# Patient Record
Sex: Female | Born: 1975 | Race: White | Hispanic: No | Smoking: Never smoker
Health system: Southern US, Community
[De-identification: ages and names within clinical notes are randomized; demographics above are authoritative.]

## PROBLEM LIST (undated history)

## (undated) ENCOUNTER — Ambulatory Visit: Admission: EM | Payer: No Typology Code available for payment source

## (undated) DIAGNOSIS — K449 Diaphragmatic hernia without obstruction or gangrene: Secondary | ICD-10-CM

## (undated) DIAGNOSIS — R112 Nausea with vomiting, unspecified: Secondary | ICD-10-CM

## (undated) DIAGNOSIS — IMO0001 Reserved for inherently not codable concepts without codable children: Secondary | ICD-10-CM

## (undated) DIAGNOSIS — N809 Endometriosis, unspecified: Secondary | ICD-10-CM

## (undated) DIAGNOSIS — Z9889 Other specified postprocedural states: Secondary | ICD-10-CM

## (undated) DIAGNOSIS — H9190 Unspecified hearing loss, unspecified ear: Secondary | ICD-10-CM

## (undated) HISTORY — PX: EAR MASTOIDECTOMY W/ COCHLEAR IMPLANT W/ LANDMARK: SHX1483

## (undated) HISTORY — DX: Endometriosis, unspecified: N80.9

## (undated) HISTORY — PX: DILATION AND CURETTAGE OF UTERUS: SHX78

---

## 2001-06-10 ENCOUNTER — Other Ambulatory Visit: Admission: RE | Admit: 2001-06-10 | Discharge: 2001-06-10 | Payer: Self-pay | Admitting: Family Medicine

## 2002-08-05 ENCOUNTER — Other Ambulatory Visit: Admission: RE | Admit: 2002-08-05 | Discharge: 2002-08-05 | Payer: Self-pay | Admitting: Family Medicine

## 2004-02-02 ENCOUNTER — Ambulatory Visit: Payer: Self-pay | Admitting: Internal Medicine

## 2004-04-25 ENCOUNTER — Ambulatory Visit: Payer: Self-pay | Admitting: Unknown Physician Specialty

## 2004-04-27 ENCOUNTER — Observation Stay: Payer: Self-pay | Admitting: Unknown Physician Specialty

## 2005-01-05 ENCOUNTER — Ambulatory Visit (HOSPITAL_COMMUNITY): Admission: RE | Admit: 2005-01-05 | Discharge: 2005-01-05 | Payer: Self-pay | Admitting: Obstetrics and Gynecology

## 2005-01-08 ENCOUNTER — Ambulatory Visit (HOSPITAL_COMMUNITY): Admission: RE | Admit: 2005-01-08 | Discharge: 2005-01-08 | Payer: Self-pay | Admitting: Obstetrics and Gynecology

## 2005-01-29 HISTORY — PX: DIAGNOSTIC LAPAROSCOPY: SUR761

## 2005-04-06 ENCOUNTER — Other Ambulatory Visit: Admission: RE | Admit: 2005-04-06 | Discharge: 2005-04-06 | Payer: Self-pay | Admitting: Obstetrics and Gynecology

## 2006-03-15 ENCOUNTER — Inpatient Hospital Stay (HOSPITAL_COMMUNITY): Admission: AD | Admit: 2006-03-15 | Discharge: 2006-03-18 | Payer: Self-pay | Admitting: Obstetrics and Gynecology

## 2006-11-08 IMAGING — US US OB TRANSVAGINAL
1 series · 14 of 24 positions shown · non-contrast
Comparison: none

CLINICAL DATA: No embryonic cardiac activity in the office.  
TRANSVAGINAL OBSTETRICAL US:
TECHNIQUE: Transvaginal ultrasound was performed for evaluation of the gestation as well as the maternal uterus and adnexal regions.

[Series 1: us ob transvaginal · 0.17mm/px · 14 of 24 slices shown]
[im 1/24]
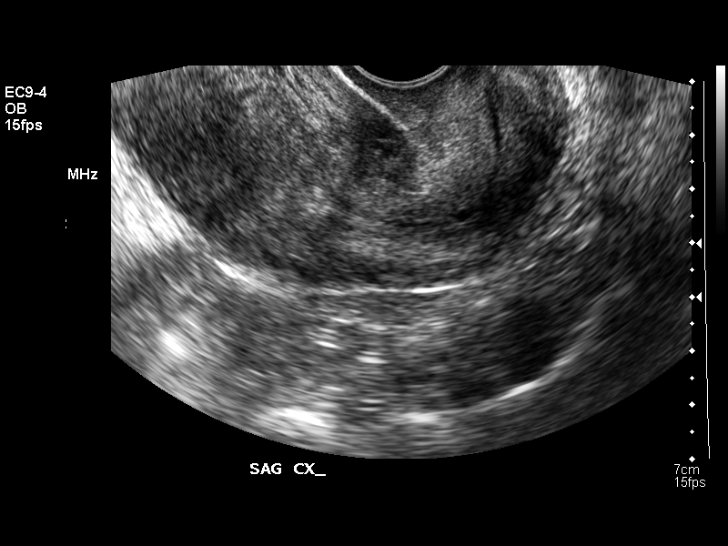
[im 3/24]
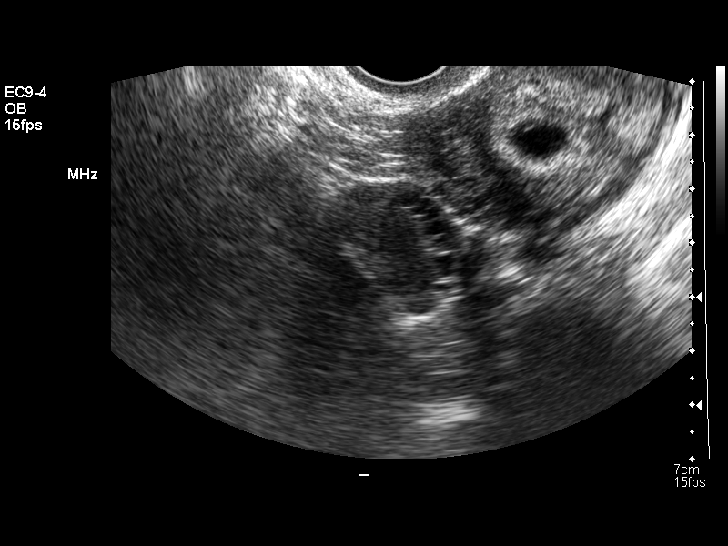
[im 5/24]
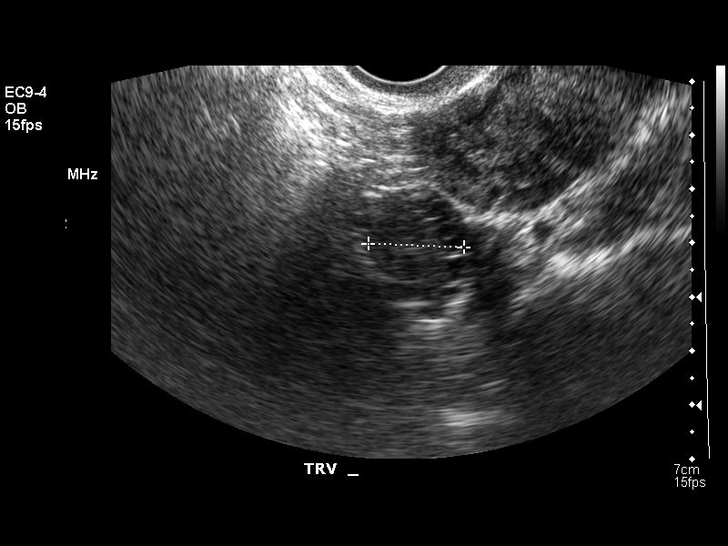
[im 7/24]
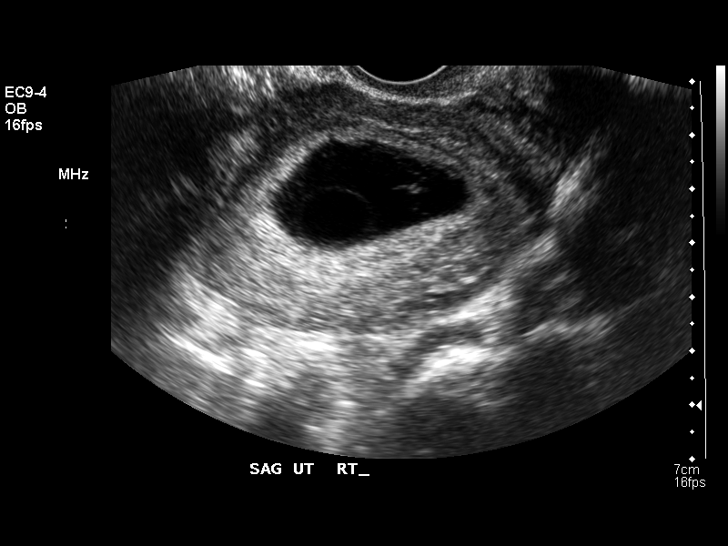
[im 8/24]
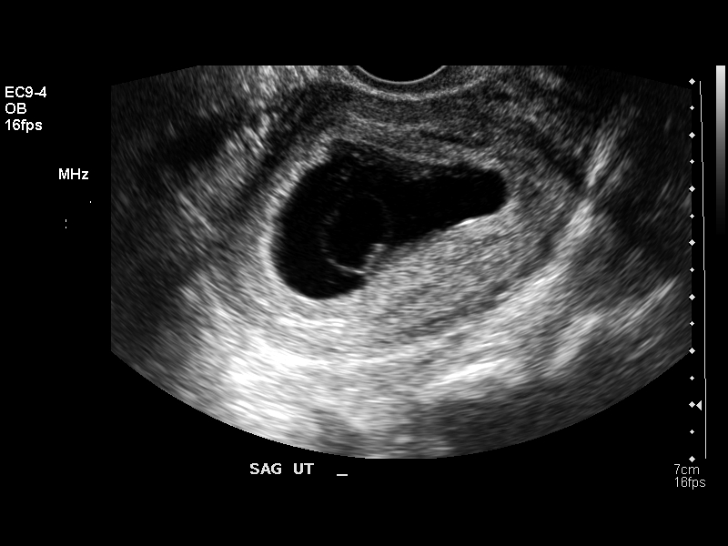
[im 10/24]
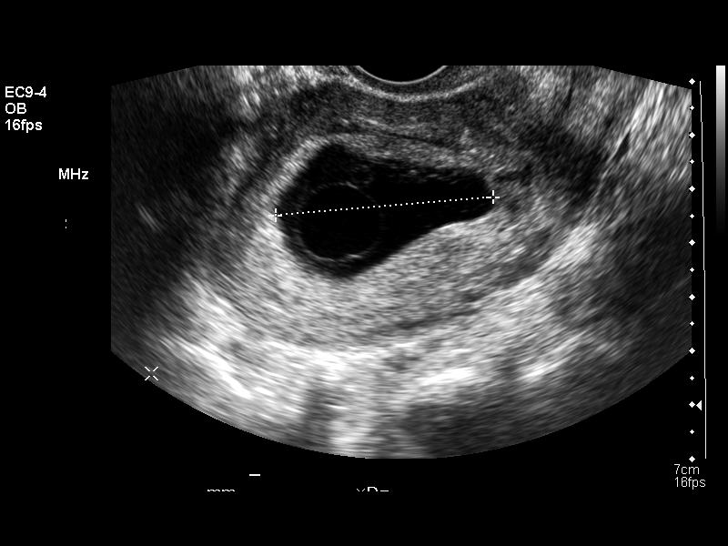
[im 12/24]
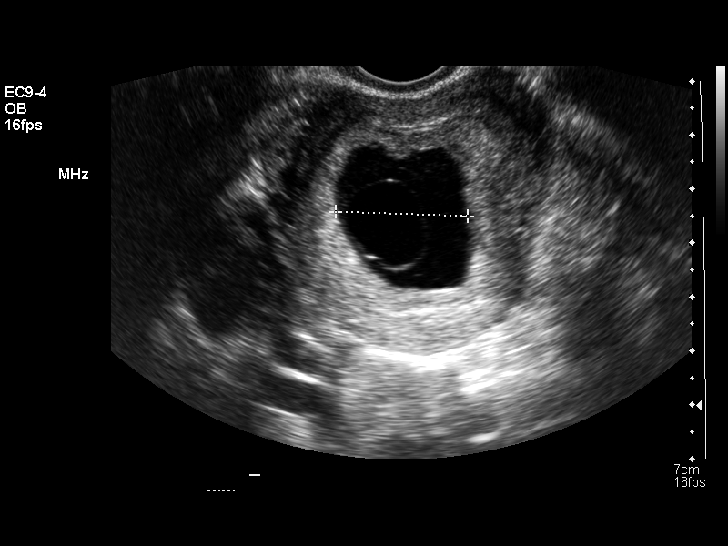
[im 13/24]
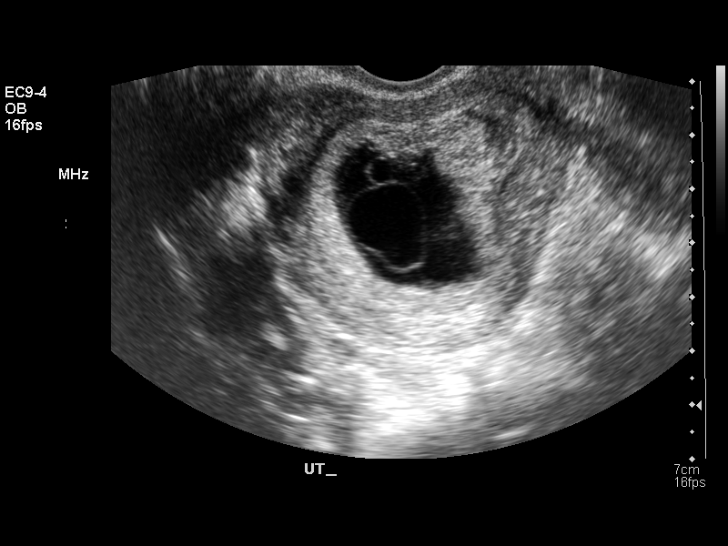
[im 15/24]
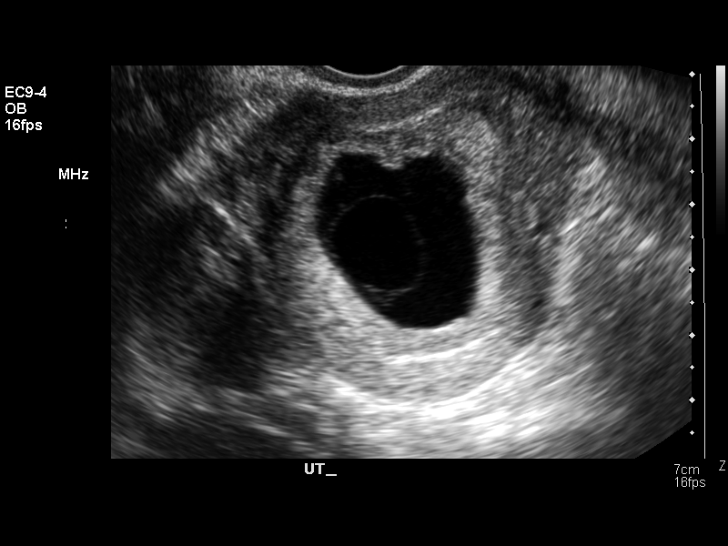
[im 17/24]
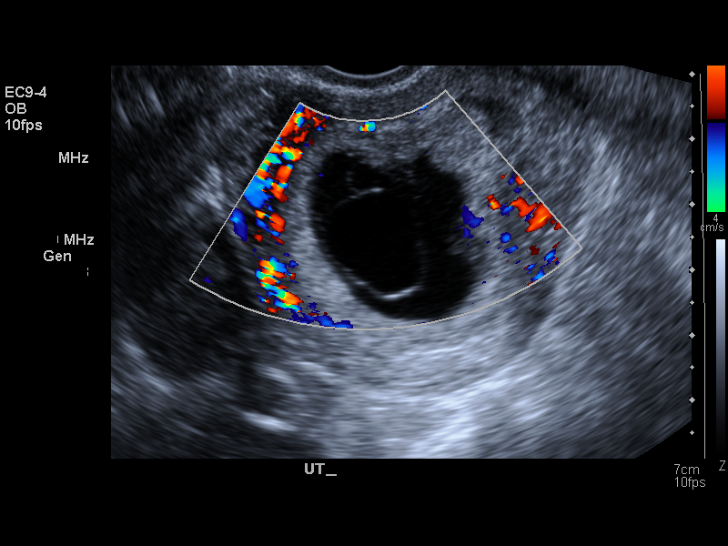
[im 19/24]
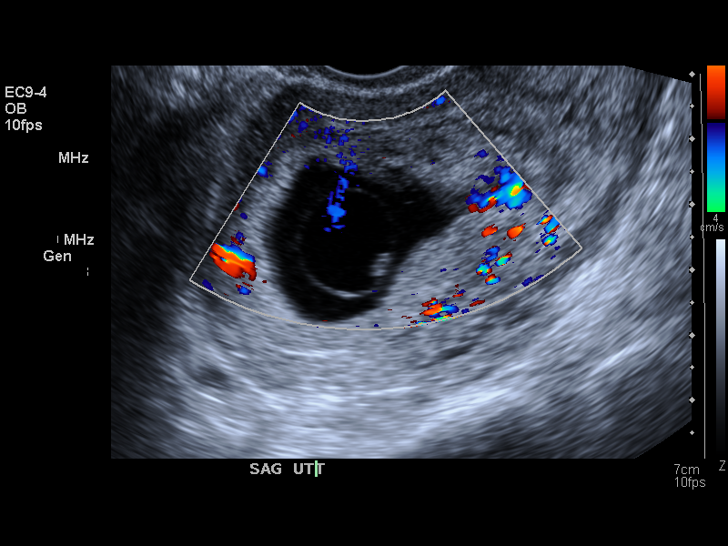
[im 20/24]
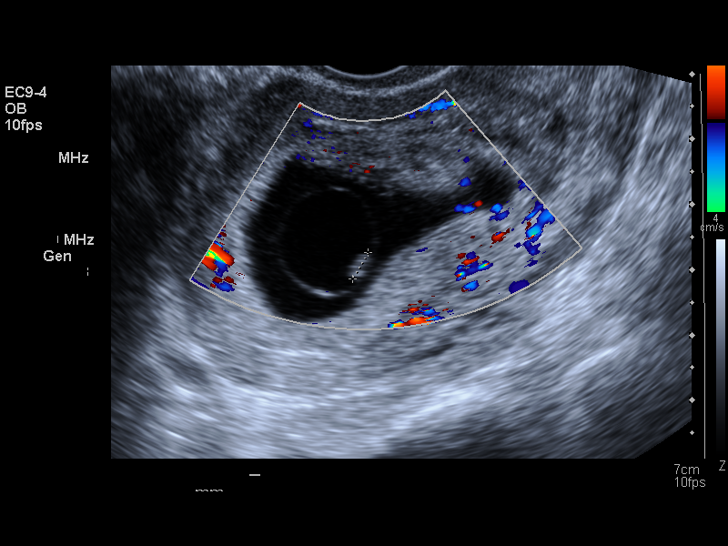
[im 22/24]
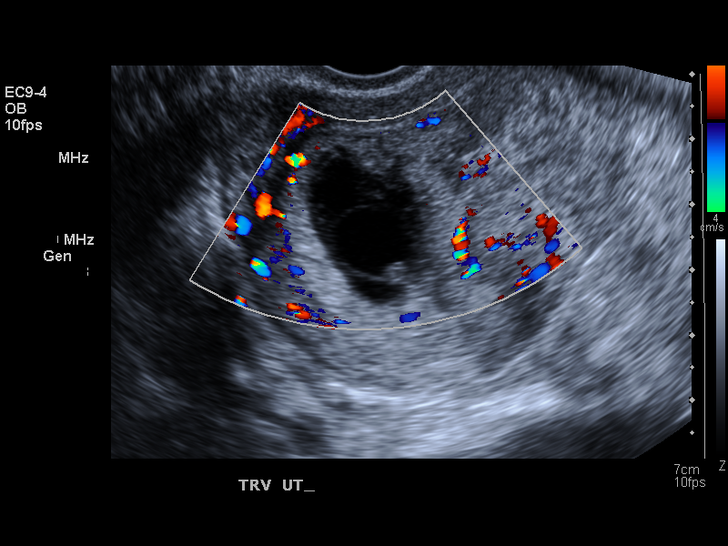
[im 24/24]
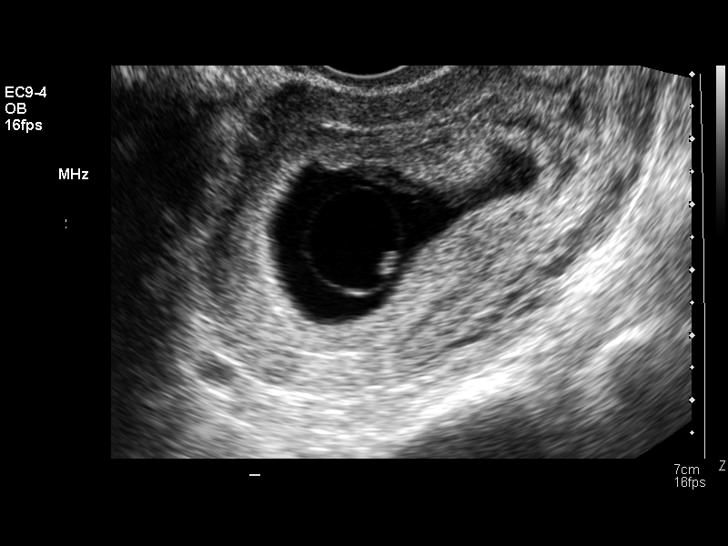

[14 of 24 positions shown; findings below may reference images not displayed]

FINDINGS: Endovaginal scanning demonstrates an intrauterine gestational sac.  The sac is irregular and has a mean sac diameter of approximately 3 cm which would suggest an 8 week 1 day gestation.  There is a very tiny embryonic pole identified within the amnion and crown rump length for this embryonic pole would be consistent with a 6 week 1 day gestation.  There is no evidence for embryonic cardiac motion on 2D Grayscale or Doppler imaging.
Right ovary is unremarkable.  The left ovary could not be identified.
IMPRESSION: Irregular gestational sac with a very tiny embryonic pole and an amnion which is enlarged out of proportion to the size of the embryo.  Correlation with the patient?s previous in office ultrasound is recommended as features are consistent with embryonic demise.

## 2010-05-04 ENCOUNTER — Other Ambulatory Visit: Payer: Self-pay | Admitting: Obstetrics and Gynecology

## 2010-05-10 ENCOUNTER — Other Ambulatory Visit: Payer: Self-pay

## 2010-05-11 ENCOUNTER — Ambulatory Visit
Admission: RE | Admit: 2010-05-11 | Discharge: 2010-05-11 | Disposition: A | Discharge status: Home / Self Care | Payer: PRIVATE HEALTH INSURANCE | Source: Ambulatory Visit | Attending: Obstetrics and Gynecology | Admitting: Obstetrics and Gynecology

## 2011-03-15 LAB — OB RESULTS CONSOLE GC/CHLAMYDIA: Gonorrhea: NEGATIVE

## 2011-03-15 LAB — OB RESULTS CONSOLE RPR: RPR: NONREACTIVE

## 2011-03-15 LAB — OB RESULTS CONSOLE ABO/RH: RH Type: POSITIVE

## 2011-08-21 ENCOUNTER — Other Ambulatory Visit: Payer: Self-pay | Admitting: Obstetrics and Gynecology

## 2011-08-27 ENCOUNTER — Encounter (HOSPITAL_COMMUNITY): Payer: Self-pay | Admitting: *Deleted

## 2011-08-27 ENCOUNTER — Inpatient Hospital Stay (HOSPITAL_COMMUNITY)
Admission: AD | Admit: 2011-08-27 | Discharge: 2011-08-31 | DRG: 765 | Disposition: A | Discharge status: Home / Self Care | Payer: PRIVATE HEALTH INSURANCE | Source: Ambulatory Visit | Attending: Obstetrics and Gynecology | Admitting: Obstetrics and Gynecology

## 2011-08-27 DIAGNOSIS — O479 False labor, unspecified: Secondary | ICD-10-CM

## 2011-08-27 DIAGNOSIS — O34219 Maternal care for unspecified type scar from previous cesarean delivery: Principal | ICD-10-CM | POA: Diagnosis present

## 2011-08-27 HISTORY — DX: Diaphragmatic hernia without obstruction or gangrene: K44.9

## 2011-08-27 HISTORY — DX: Other specified postprocedural states: R11.2

## 2011-08-27 HISTORY — DX: Unspecified hearing loss, unspecified ear: H91.90

## 2011-08-27 HISTORY — DX: Other specified postprocedural states: Z98.890

## 2011-08-27 HISTORY — DX: Reserved for inherently not codable concepts without codable children: IMO0001

## 2011-08-27 LAB — URINALYSIS, ROUTINE W REFLEX MICROSCOPIC
Hgb urine dipstick: NEGATIVE
Nitrite: NEGATIVE
Protein, ur: NEGATIVE mg/dL
Urobilinogen, UA: 0.2 mg/dL (ref 0.0–1.0)

## 2011-08-27 MED ORDER — LACTATED RINGERS IV SOLN
INTRAVENOUS | Status: DC
  Administered 2011-08-27 (×3): via INTRAVENOUS

## 2011-08-27 MED ORDER — MORPHINE SULFATE 4 MG/ML IJ SOLN
4.0000 mg | Freq: Once | INTRAMUSCULAR | Status: AC
  Administered 2011-08-27: 4 mg via INTRAVENOUS
  Filled 2011-08-27: qty 1

## 2011-08-27 MED ORDER — MORPHINE SULFATE 4 MG/ML IJ SOLN
1.0000 mg | Freq: Once | INTRAMUSCULAR | Status: AC
  Administered 2011-08-27: 1 mg via INTRAVENOUS

## 2011-08-27 MED ORDER — MORPHINE SULFATE 10 MG/ML IJ SOLN
5.0000 mg | Freq: Once | INTRAMUSCULAR | Status: AC
  Administered 2011-08-27: 5 mg via INTRAMUSCULAR
  Filled 2011-08-27: qty 1

## 2011-08-27 MED ORDER — PROMETHAZINE HCL 25 MG PO TABS
25.0000 mg | ORAL_TABLET | Freq: Once | ORAL | Status: AC
  Administered 2011-08-27: 25 mg via ORAL
  Filled 2011-08-27 (×2): qty 1

## 2011-08-27 MED ORDER — BUTORPHANOL TARTRATE 1 MG/ML IJ SOLN
2.0000 mg | Freq: Once | INTRAMUSCULAR | Status: AC
  Administered 2011-08-27: 2 mg via INTRAVENOUS
  Filled 2011-08-27: qty 2

## 2011-08-27 MED ORDER — HYDROMORPHONE HCL PF 1 MG/ML IJ SOLN
0.4000 mg | INTRAMUSCULAR | Status: DC | PRN
  Administered 2011-08-27: 0.4 mg via INTRAVENOUS
  Filled 2011-08-27: qty 1

## 2011-08-27 MED ORDER — NIFEDIPINE 10 MG PO CAPS
20.0000 mg | ORAL_CAPSULE | Freq: Three times a day (TID) | ORAL | Status: DC
  Administered 2011-08-27 – 2011-08-28 (×2): 20 mg via ORAL
  Filled 2011-08-27 (×3): qty 2

## 2011-08-27 MED ORDER — TERBUTALINE SULFATE 1 MG/ML IJ SOLN
0.2500 mg | Freq: Once | INTRAMUSCULAR | Status: AC
  Administered 2011-08-27: 0.25 mg via SUBCUTANEOUS
  Filled 2011-08-27: qty 1

## 2011-08-27 NOTE — MAU Note (Signed)
Scheduled for repeat c-section on 09/20/11;

## 2011-08-27 NOTE — MAU Provider Note (Signed)
History     CSN: [id number]  Arrival date and time: 08/27/11 1027   First Provider Initiated Contact with Patient 08/27/11 1220      Chief Complaint  Patient presents with  . Labor Eval   HPI Misty Lowe is a 36 y.o. female @ 102w4d gestation who presents to MAU for abdominal pain. The pain started approximately 05:30 am. She describes the pain sharp that comes and goes. She rates the pain as 9/10 at its worst and 3/10 currently. Associated symptoms include low back pain. She denies nausea or vomiting. First pregnancy preterm delivery @ [redacted] weeks gestation. C/S re: breech presentation. Scheduled for repeat C/S.  The history was provided by the patient.  OB History    Grav Para Term Preterm Abortions TAB SAB Ect Mult Living   2 1  1      1       Past Medical History  Diagnosis Date  . PONV (postoperative nausea and vomiting)   . Hearing impairment   . Hiatal hernia     Past Surgical History  Procedure Date  . Cesarean section   . Dilation and curettage of uterus   . 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 Other Neg Hx     History  Substance Use Topics  . Smoking status: Never Smoker   . Smokeless tobacco: Not on file  . Alcohol Use: No    Allergies:  Allergies  Allergen Reactions  . Amoxil (Amoxicillin) Anaphylaxis    Prescriptions prior to admission  Medication Sig Dispense Refill  . Prenatal Vit-Fe Fumarate-FA (PRENATAL MULTIVITAMIN) TABS Take 1 tablet by mouth at bedtime.      . progesterone (PROMETRIUM) 200 MG capsule Place 200 mg vaginally at bedtime.      . simethicone (GAS-X) 80 MG chewable tablet Chew 80 mg by mouth daily as needed. For heartburn        Review of Systems  Constitutional: Negative for fever, chills and weight loss.  HENT: Negative for ear pain, nosebleeds, congestion, sore throat and neck pain.   Eyes: Negative for blurred vision, double vision, photophobia and  pain.  Respiratory: Negative for cough, shortness of breath and wheezing.   Cardiovascular: Negative for chest pain, palpitations and leg swelling.  Gastrointestinal: Positive for abdominal pain. Negative for heartburn, nausea, vomiting, diarrhea and constipation.  Genitourinary: Positive for dysuria and frequency. Negative for urgency.  Musculoskeletal: Positive for back pain. Negative for myalgias.  Skin: Negative for itching and rash.  Neurological: Negative for dizziness, sensory change, speech change, seizures, weakness and headaches.  Endo/Heme/Allergies: Does not bruise/bleed easily.  Psychiatric/Behavioral: Negative for depression. The patient is not nervous/anxious.    Physical Exam   Blood pressure 111/76, pulse 71, temperature 97.6 F (36.4 C), temperature source Oral, resp. rate 18, height 5\' 1"  (1.549 m), weight 161 lb (73.029 kg).  Physical Exam  Nursing note and vitals reviewed. Constitutional: She is oriented to person, place, and time. She appears well-developed and well-nourished. No distress.  HENT:  Head: Normocephalic and atraumatic.  Eyes: EOM are normal.  Neck: Neck supple.  Cardiovascular: Normal rate.   Respiratory: Effort normal.  GI: Soft. There is no tenderness.  Genitourinary:       Cervix 2 cm, 90% -3.  Musculoskeletal: Normal range of motion.  Neurological: She is alert and oriented to person, place, and time.  Skin: Skin is warm and dry.  Psychiatric: She  has a normal mood and affect. Her behavior is normal. Judgment and thought content normal.   EFM: Baseline 130, reactive, contractions every 2 - 9 minutes.   Assessment: Preterm contractions  Plan:       Dr. Claiborne Billings notified and request IV fluids and recheck cervix in 1 to 2 hours.   Procedures  16:30 pm Dr. Claiborne Billings in to evaluate patient.  , 08/27/2011, 12:43 PM

## 2011-08-27 NOTE — MAU Note (Signed)
Hx of preterm labor and delivery at 34 weeks; c/o sharp pressure in lower abdomen since last night;

## 2011-08-27 NOTE — Progress Notes (Signed)
Pt still very uncomfortable with contractions, reports back pain and lower abd pain R>L. Today patient has received 5mg IM/5mg PO morphine with little relief.  She then received 0.4 IV dilaudid x 1 with some relief but requesting add'l dose after 1.5 hrs.  Pt received 20mg  po procardia, not due until approx 2am, also received one dose of terbutaline 0.25mg  iv with about 45 min of relief with ctx coming back mild at first, now 8/10.  On exam cervix is still a loose 2cm and 90 effaced, no change from 3 prior cervical checks throughout the day.  On palpation of lower abd, no tenderness but pt reports pain with ctx.  FHT have remained reactive and reassurring.  Ctx now 2-5 min.  Will try different pain regimen of 25po phenergan and 2mg  IV stadol.  Discussed with pt that since cervix is not changing c/s would not be indicated since she is later preterm.  Pt understands and will report worsening pain, bleeding, LOF for repeat cervical check.

## 2011-08-27 NOTE — MAU Note (Addendum)
Has been having small amt of thickish clear discharge past few days.  Having lower abd pain, since 0530, coming and going.   Delivered first child early

## 2011-08-27 NOTE — MAU Note (Signed)
On progesterone for PTL.  Scheduled for c/s on 08/22, primary was for breech.

## 2011-08-28 ENCOUNTER — Encounter (HOSPITAL_COMMUNITY): Payer: Self-pay | Admitting: *Deleted

## 2011-08-28 ENCOUNTER — Encounter (HOSPITAL_COMMUNITY): Payer: Self-pay | Admitting: Anesthesiology

## 2011-08-28 ENCOUNTER — Inpatient Hospital Stay (HOSPITAL_COMMUNITY): Payer: PRIVATE HEALTH INSURANCE | Admitting: Anesthesiology

## 2011-08-28 ENCOUNTER — Encounter (HOSPITAL_COMMUNITY)
Admission: AD | Disposition: A | Discharge status: Home / Self Care | Payer: Self-pay | Source: Ambulatory Visit | Attending: Obstetrics and Gynecology

## 2011-08-28 LAB — RPR: RPR Ser Ql: NONREACTIVE

## 2011-08-28 LAB — CBC
HCT: 33.9 % — ABNORMAL LOW (ref 36.0–46.0)
Hemoglobin: 11.6 g/dL — ABNORMAL LOW (ref 12.0–15.0)
MCH: 30.9 pg (ref 26.0–34.0)
RBC: 3.76 MIL/uL — ABNORMAL LOW (ref 3.87–5.11)

## 2011-08-28 LAB — TYPE AND SCREEN: ABO/RH(D): A POS

## 2011-08-28 SURGERY — Surgical Case
Anesthesia: Spinal | Site: Abdomen | Wound class: Clean Contaminated

## 2011-08-28 MED ORDER — TETANUS-DIPHTH-ACELL PERTUSSIS 5-2.5-18.5 LF-MCG/0.5 IM SUSP
0.5000 mL | Freq: Once | INTRAMUSCULAR | Status: AC
  Administered 2011-08-30: 0.5 mL via INTRAMUSCULAR
  Filled 2011-08-28: qty 0.5

## 2011-08-28 MED ORDER — PRENATAL MULTIVITAMIN CH
1.0000 | ORAL_TABLET | Freq: Every day | ORAL | Status: DC
  Administered 2011-08-28 – 2011-08-30 (×3): 1 via ORAL
  Filled 2011-08-28 (×4): qty 1

## 2011-08-28 MED ORDER — EPHEDRINE SULFATE 50 MG/ML IJ SOLN
INTRAMUSCULAR | Status: DC | PRN
  Administered 2011-08-28 (×2): 5 mg via INTRAVENOUS

## 2011-08-28 MED ORDER — METOCLOPRAMIDE HCL 5 MG/ML IJ SOLN: 10.0000 mg | Freq: Three times a day (TID) | INTRAMUSCULAR | Status: DC | PRN

## 2011-08-28 MED ORDER — GENTAMICIN SULFATE 40 MG/ML IJ SOLN: 5.0000 mg/kg | Freq: Once | INTRAVENOUS | Status: DC

## 2011-08-28 MED ORDER — LACTATED RINGERS IV SOLN
INTRAVENOUS | Status: DC
  Administered 2011-08-28: 14:00:00 via INTRAVENOUS

## 2011-08-28 MED ORDER — MEPERIDINE HCL 25 MG/ML IJ SOLN: 6.2500 mg | INTRAMUSCULAR | Status: DC | PRN

## 2011-08-28 MED ORDER — WITCH HAZEL-GLYCERIN EX PADS: 1.0000 "application " | MEDICATED_PAD | CUTANEOUS | Status: DC | PRN

## 2011-08-28 MED ORDER — ZOLPIDEM TARTRATE 5 MG PO TABS: 5.0000 mg | ORAL_TABLET | Freq: Every evening | ORAL | Status: DC | PRN

## 2011-08-28 MED ORDER — OXYTOCIN 40 UNITS IN LACTATED RINGERS INFUSION - SIMPLE MED: 62.5000 mL/h | INTRAVENOUS | Status: AC

## 2011-08-28 MED ORDER — MORPHINE SULFATE 0.5 MG/ML IJ SOLN
INTRAMUSCULAR | Status: AC
  Filled 2011-08-28: qty 10

## 2011-08-28 MED ORDER — OXYTOCIN 10 UNIT/ML IJ SOLN
INTRAMUSCULAR | Status: AC
  Filled 2011-08-28: qty 4

## 2011-08-28 MED ORDER — LACTATED RINGERS IV SOLN
INTRAVENOUS | Status: DC | PRN
  Administered 2011-08-28 (×2): via INTRAVENOUS

## 2011-08-28 MED ORDER — SODIUM CHLORIDE 0.9 % IV SOLN
1.0000 ug/kg/h | INTRAVENOUS | Status: DC | PRN
  Filled 2011-08-28: qty 2.5

## 2011-08-28 MED ORDER — PHENYLEPHRINE HCL 10 MG/ML IJ SOLN
INTRAMUSCULAR | Status: DC | PRN
  Administered 2011-08-28 (×3): 80 ug via INTRAVENOUS
  Administered 2011-08-28 (×2): 40 ug via INTRAVENOUS
  Administered 2011-08-28 (×5): 80 ug via INTRAVENOUS

## 2011-08-28 MED ORDER — HYDROMORPHONE HCL PF 1 MG/ML IJ SOLN: 0.2500 mg | INTRAMUSCULAR | Status: DC | PRN

## 2011-08-28 MED ORDER — ONDANSETRON HCL 4 MG/2ML IJ SOLN
INTRAMUSCULAR | Status: DC | PRN
  Administered 2011-08-28: 4 mg via INTRAVENOUS

## 2011-08-28 MED ORDER — NALOXONE HCL 0.4 MG/ML IJ SOLN: 0.4000 mg | INTRAMUSCULAR | Status: DC | PRN

## 2011-08-28 MED ORDER — KETOROLAC TROMETHAMINE 30 MG/ML IJ SOLN: 30.0000 mg | Freq: Four times a day (QID) | INTRAMUSCULAR | Status: AC | PRN

## 2011-08-28 MED ORDER — SIMETHICONE 80 MG PO CHEW
80.0000 mg | CHEWABLE_TABLET | Freq: Three times a day (TID) | ORAL | Status: DC
  Administered 2011-08-28 – 2011-08-31 (×9): 80 mg via ORAL

## 2011-08-28 MED ORDER — ONDANSETRON HCL 4 MG/2ML IJ SOLN: 4.0000 mg | Freq: Three times a day (TID) | INTRAMUSCULAR | Status: DC | PRN

## 2011-08-28 MED ORDER — PHENYLEPHRINE 40 MCG/ML (10ML) SYRINGE FOR IV PUSH (FOR BLOOD PRESSURE SUPPORT)
PREFILLED_SYRINGE | INTRAVENOUS | Status: AC
  Filled 2011-08-28: qty 20

## 2011-08-28 MED ORDER — CLINDAMYCIN PHOSPHATE 900 MG/50ML IV SOLN: 900.0000 mg | Freq: Once | INTRAVENOUS | Status: DC

## 2011-08-28 MED ORDER — KETOROLAC TROMETHAMINE 60 MG/2ML IM SOLN
60.0000 mg | Freq: Once | INTRAMUSCULAR | Status: AC | PRN
  Administered 2011-08-28: 60 mg via INTRAMUSCULAR

## 2011-08-28 MED ORDER — NALBUPHINE SYRINGE 5 MG/0.5 ML
INJECTION | INTRAMUSCULAR | Status: AC
  Administered 2011-08-28: 10 mg via SUBCUTANEOUS
  Filled 2011-08-28: qty 1

## 2011-08-28 MED ORDER — PRENATAL MULTIVITAMIN CH
1.0000 | ORAL_TABLET | Freq: Every day | ORAL | Status: DC
  Administered 2011-08-29 – 2011-08-30 (×2): 1 via ORAL
  Filled 2011-08-28: qty 1

## 2011-08-28 MED ORDER — SIMETHICONE 80 MG PO CHEW: 80.0000 mg | CHEWABLE_TABLET | ORAL | Status: DC | PRN

## 2011-08-28 MED ORDER — ONDANSETRON HCL 4 MG/2ML IJ SOLN: 4.0000 mg | INTRAMUSCULAR | Status: DC | PRN

## 2011-08-28 MED ORDER — DIPHENHYDRAMINE HCL 50 MG/ML IJ SOLN: 25.0000 mg | INTRAMUSCULAR | Status: DC | PRN

## 2011-08-28 MED ORDER — DIPHENHYDRAMINE HCL 25 MG PO CAPS: 25.0000 mg | ORAL_CAPSULE | Freq: Four times a day (QID) | ORAL | Status: DC | PRN

## 2011-08-28 MED ORDER — IBUPROFEN 600 MG PO TABS
600.0000 mg | ORAL_TABLET | Freq: Four times a day (QID) | ORAL | Status: DC | PRN
  Filled 2011-08-28 (×7): qty 1

## 2011-08-28 MED ORDER — SCOPOLAMINE 1 MG/3DAYS TD PT72
MEDICATED_PATCH | TRANSDERMAL | Status: AC
  Administered 2011-08-28: 1.5 mg via TRANSDERMAL
  Filled 2011-08-28: qty 1

## 2011-08-28 MED ORDER — IBUPROFEN 600 MG PO TABS
600.0000 mg | ORAL_TABLET | Freq: Four times a day (QID) | ORAL | Status: DC
  Administered 2011-08-28 – 2011-08-31 (×13): 600 mg via ORAL
  Filled 2011-08-28 (×6): qty 1

## 2011-08-28 MED ORDER — SCOPOLAMINE 1 MG/3DAYS TD PT72
1.0000 | MEDICATED_PATCH | Freq: Once | TRANSDERMAL | Status: DC
  Administered 2011-08-28: 1.5 mg via TRANSDERMAL

## 2011-08-28 MED ORDER — CITRIC ACID-SODIUM CITRATE 334-500 MG/5ML PO SOLN
ORAL | Status: AC
  Filled 2011-08-28: qty 15

## 2011-08-28 MED ORDER — SODIUM CHLORIDE 0.9 % IJ SOLN: 3.0000 mL | INTRAMUSCULAR | Status: DC | PRN

## 2011-08-28 MED ORDER — MENTHOL 3 MG MT LOZG: 1.0000 | LOZENGE | OROMUCOSAL | Status: DC | PRN

## 2011-08-28 MED ORDER — GENTAMICIN SULFATE 40 MG/ML IJ SOLN
Freq: Once | INTRAVENOUS | Status: AC
  Administered 2011-08-28: 100 mL via INTRAVENOUS
  Filled 2011-08-28: qty 7.24

## 2011-08-28 MED ORDER — FENTANYL CITRATE 0.05 MG/ML IJ SOLN
INTRAMUSCULAR | Status: AC
  Filled 2011-08-28: qty 2

## 2011-08-28 MED ORDER — OXYCODONE-ACETAMINOPHEN 5-325 MG PO TABS
1.0000 | ORAL_TABLET | ORAL | Status: DC | PRN
  Administered 2011-08-28: 1 via ORAL
  Administered 2011-08-29: 2 via ORAL
  Administered 2011-08-30 – 2011-08-31 (×4): 1 via ORAL
  Filled 2011-08-28 (×4): qty 1
  Filled 2011-08-28: qty 2
  Filled 2011-08-28: qty 1

## 2011-08-28 MED ORDER — ONDANSETRON HCL 4 MG PO TABS: 4.0000 mg | ORAL_TABLET | ORAL | Status: DC | PRN

## 2011-08-28 MED ORDER — NALBUPHINE HCL 10 MG/ML IJ SOLN
5.0000 mg | INTRAMUSCULAR | Status: DC | PRN
  Filled 2011-08-28: qty 1

## 2011-08-28 MED ORDER — EPHEDRINE 5 MG/ML INJ
INTRAVENOUS | Status: AC
  Filled 2011-08-28: qty 10

## 2011-08-28 MED ORDER — ONDANSETRON HCL 4 MG/2ML IJ SOLN
INTRAMUSCULAR | Status: AC
  Filled 2011-08-28: qty 2

## 2011-08-28 MED ORDER — NALBUPHINE HCL 10 MG/ML IJ SOLN
5.0000 mg | INTRAMUSCULAR | Status: DC | PRN
  Administered 2011-08-28: 10 mg via SUBCUTANEOUS
  Filled 2011-08-28: qty 1

## 2011-08-28 MED ORDER — DIPHENHYDRAMINE HCL 50 MG/ML IJ SOLN: 12.5000 mg | INTRAMUSCULAR | Status: DC | PRN

## 2011-08-28 MED ORDER — LANOLIN HYDROUS EX OINT: 1.0000 "application " | TOPICAL_OINTMENT | CUTANEOUS | Status: DC | PRN

## 2011-08-28 MED ORDER — SENNOSIDES-DOCUSATE SODIUM 8.6-50 MG PO TABS
2.0000 | ORAL_TABLET | Freq: Every day | ORAL | Status: DC
  Administered 2011-08-28 – 2011-08-30 (×3): 2 via ORAL

## 2011-08-28 MED ORDER — OXYTOCIN 10 UNIT/ML IJ SOLN
40.0000 [IU] | INTRAVENOUS | Status: DC | PRN
  Administered 2011-08-28: 40 [IU] via INTRAVENOUS

## 2011-08-28 MED ORDER — DIBUCAINE 1 % RE OINT: 1.0000 "application " | TOPICAL_OINTMENT | RECTAL | Status: DC | PRN

## 2011-08-28 MED ORDER — DIPHENHYDRAMINE HCL 25 MG PO CAPS: 25.0000 mg | ORAL_CAPSULE | ORAL | Status: DC | PRN

## 2011-08-28 MED ORDER — KETOROLAC TROMETHAMINE 60 MG/2ML IM SOLN
INTRAMUSCULAR | Status: AC
  Administered 2011-08-28: 60 mg via INTRAMUSCULAR
  Filled 2011-08-28: qty 2

## 2011-08-28 SURGICAL SUPPLY — 30 items
CHLORAPREP W/TINT 26ML (MISCELLANEOUS) ×2 IMPLANT
CLOTH BEACON ORANGE TIMEOUT ST (SAFETY) ×2 IMPLANT
DRESSING TELFA 8X3 (GAUZE/BANDAGES/DRESSINGS) ×2 IMPLANT
DRSG COVADERM 4X10 (GAUZE/BANDAGES/DRESSINGS) ×2 IMPLANT
ELECT REM PT RETURN 9FT ADLT (ELECTROSURGICAL) ×2
ELECTRODE REM PT RTRN 9FT ADLT (ELECTROSURGICAL) ×1 IMPLANT
EXTRACTOR VACUUM M CUP 4 TUBE (SUCTIONS) IMPLANT
GAUZE SPONGE 4X4 12PLY STRL LF (GAUZE/BANDAGES/DRESSINGS) ×4 IMPLANT
GLOVE BIO SURGEON STRL SZ 6.5 (GLOVE) ×2 IMPLANT
GLOVE BIOGEL PI IND STRL 6.5 (GLOVE) ×2 IMPLANT
GLOVE BIOGEL PI INDICATOR 6.5 (GLOVE) ×2
GOWN PREVENTION PLUS LG XLONG (DISPOSABLE) ×4 IMPLANT
KIT ABG SYR 3ML LUER SLIP (SYRINGE) IMPLANT
NEEDLE HYPO 25X5/8 SAFETYGLIDE (NEEDLE) IMPLANT
NS IRRIG 1000ML POUR BTL (IV SOLUTION) ×2 IMPLANT
PACK C SECTION WH (CUSTOM PROCEDURE TRAY) ×2 IMPLANT
PAD ABD 7.5X8 STRL (GAUZE/BANDAGES/DRESSINGS) ×2 IMPLANT
RTRCTR C-SECT PINK 25CM LRG (MISCELLANEOUS) ×2 IMPLANT
SLEEVE SCD COMPRESS KNEE MED (MISCELLANEOUS) ×2 IMPLANT
STAPLER VISISTAT 35W (STAPLE) ×2 IMPLANT
SUT MON AB 2-0 CT1 27 (SUTURE) ×2 IMPLANT
SUT MON AB 4-0 PS1 27 (SUTURE) IMPLANT
SUT PLAIN 2 0 XLH (SUTURE) IMPLANT
SUT VIC AB 0 CTX 36 (SUTURE) ×8
SUT VIC AB 0 CTX36XBRD ANBCTRL (SUTURE) ×4 IMPLANT
SUT VIC AB 4-0 KS 27 (SUTURE) IMPLANT
TAPE CLOTH SURG 4X10 WHT LF (GAUZE/BANDAGES/DRESSINGS) ×2 IMPLANT
TOWEL OR 17X24 6PK STRL BLUE (TOWEL DISPOSABLE) ×4 IMPLANT
TRAY FOLEY CATH 14FR (SET/KITS/TRAYS/PACK) ×2 IMPLANT
WATER STERILE IRR 1000ML POUR (IV SOLUTION) IMPLANT

## 2011-08-28 NOTE — Anesthesia Postprocedure Evaluation (Signed)
  Anesthesia Post Note  Patient: Misty Lowe  Procedure(s) Performed: Procedure(s) (LRB): CESAREAN SECTION (N/A)  Anesthesia type: Spinal  Patient location: Mother/Baby  Post pain: Pain level controlled  Post assessment: Post-op Vital signs reviewed  Last Vitals:  Filed Vitals:   08/28/11 1400  BP:   Pulse:   Temp:   Resp: 18    Post vital signs: Reviewed  Level of consciousness: awake  Complications: No apparent anesthesia complications

## 2011-08-28 NOTE — Anesthesia Preprocedure Evaluation (Addendum)
Anesthesia Evaluation  Patient identified by MRN, date of birth, ID band Patient awake    Reviewed: Allergy & Precautions, H&P , Patient's Chart, lab work & pertinent test results  History of Anesthesia Complications (+) PONV  Airway Mallampati: II TM Distance: >3 FB Neck ROM: full    Dental No notable dental hx.    Pulmonary  breath sounds clear to auscultation  Pulmonary exam normal       Cardiovascular Exercise Tolerance: Good Rhythm:regular Rate:Normal     Neuro/Psych    GI/Hepatic   Endo/Other    Renal/GU      Musculoskeletal   Abdominal   Peds  Hematology   Anesthesia Other Findings   Reproductive/Obstetrics                           Anesthesia Physical Anesthesia Plan  ASA: II and Emergent  Anesthesia Plan: Spinal   Post-op Pain Management:    Induction:   Airway Management Planned:   Additional Equipment:   Intra-op Plan:   Post-operative Plan:   Informed Consent: I have reviewed the patients History and Physical, chart, labs and discussed the procedure including the risks, benefits and alternatives for the proposed anesthesia with the patient or authorized representative who has indicated his/her understanding and acceptance.   Dental Advisory Given  Plan Discussed with: CRNA  Anesthesia Plan Comments: (Lab work confirmed with CRNA in room. Platelets okay. Discussed spinal anesthetic, and patient consents to the procedure:  included risk of possible headache,backache, failed block, allergic reaction, and nerve injury. This patient was asked if she had any questions or concerns before the procedure started. )        Anesthesia Quick Evaluation

## 2011-08-28 NOTE — H&P (Signed)
37 y.o. [redacted]w[redacted]d  G2P0101 comes in c/o contractions beginning about 5am.  Otherwise has good fetal movement and no bleeding.  Past Medical History  Diagnosis Date  . PONV (postoperative nausea and vomiting)   . Hearing impairment   . Hiatal hernia     Past Surgical History  Procedure Date  . Cesarean section   . Dilation and curettage of uterus   . 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 Marital Status: Married    Spouse Name: N/A    Number of Children: N/A  . Years of Education: N/A   Occupational History  . Not on file.   Social History Main Topics  . Smoking status: Never Smoker   . Smokeless tobacco: Not on file  . Alcohol Use: No  . Drug Use: No  . Sexually Active: Yes   Other Topics Concern  . Not on file   Social History Narrative  . No narrative on file   Amoxil   Prenatal Course:  Uncomplicated.  Prior c/s for PPROM/breech at ~34wks.  Filed Vitals:   08/27/11 2200  BP:   Pulse: 96  Temp:   Resp:      Lungs/Cor:  NAD Abdomen:  soft, gravid Ex:  no cords, erythema SVE:  2/90/-2, rechecked 3 times over almost 8 hrs FHTs:  120, good STV, NST R Toco:  q 5   A/P   Admit for observation and pain control, unchanged cervix  Declines VBAC  GBS unknown  See progress note for pain control regimens.  Philip Aspen

## 2011-08-28 NOTE — Anesthesia Postprocedure Evaluation (Signed)
Anesthesia Post Note  Patient: Misty Lowe  Procedure(s) Performed: Procedure(s) (LRB): CESAREAN SECTION (N/A)  Anesthesia type: Spinal  Patient location: PACU  Post pain: Pain level controlled  Post assessment: Post-op Vital signs reviewed  Last Vitals:  Filed Vitals:   08/28/11 0815  BP: 108/86  Pulse: 80  Temp:   Resp: 21    Post vital signs: Reviewed  Level of consciousness: awake  Complications: No apparent anesthesia complications

## 2011-08-28 NOTE — Addendum Note (Signed)
Addendum  created 08/28/11 1411 by Algis Greenhouse, CRNA   Modules edited:Notes Section

## 2011-08-28 NOTE — Anesthesia Procedure Notes (Signed)

## 2011-08-28 NOTE — Op Note (Addendum)
Preop dx: 35.5 preterm labor, (cervical change 2 to 4-5cm) desired repeat c/s Postop dx: same Procedure: repeat LTCS Surgeon: Delray Alt Anesthesia: spinal EBL: 600 Fluids: 2300 UOP: 350 clear at end of case Specimen: cord blood Findings: abundant scar tissue involving the bladder, vesicouterine peritoneum and anterior abd wall, as well as R adnexa - ovary not visualized.  Normal ovary and tube on L side.  Female infant in cephalic presentation, vigorous with APGARS 9/9, weight pending Complications: none Condition: stable to pacu

## 2011-08-28 NOTE — Transfer of Care (Signed)
Immediate Anesthesia Transfer of Care Note  Patient:  Misty Lowe  Procedure(s) Performed: Procedure(s) (LRB): CESAREAN SECTION (N/A)  Patient Location: PACU  Anesthesia Type: Spinal  Level of Consciousness: awake, alert  and oriented  Airway & Oxygen Therapy: Patient Spontanous Breathing  Post-op Assessment: Report given to PACU RN  Post vital signs: Reviewed and stable  Complications: No apparent anesthesia complications

## 2011-08-29 ENCOUNTER — Encounter (HOSPITAL_COMMUNITY): Payer: Self-pay | Admitting: Obstetrics and Gynecology

## 2011-08-29 LAB — CBC
Hemoglobin: 10.6 g/dL — ABNORMAL LOW (ref 12.0–15.0)
MCHC: 33.2 g/dL (ref 30.0–36.0)
RBC: 3.41 MIL/uL — ABNORMAL LOW (ref 3.87–5.11)
WBC: 9.7 10*3/uL (ref 4.0–10.5)

## 2011-08-29 NOTE — Progress Notes (Signed)
  Patient is eating, ambulating, voiding.  Pain control is good.  Filed Vitals:   08/28/11 1852 08/28/11 2100 08/29/11 0100 08/29/11 0500  BP:  93/53 91/52 87/52   Pulse:  93 96 74  Temp:  99.1 F (37.3 C) 98.5 F (36.9 C) 98.5 F (36.9 C)  TempSrc:  Oral Oral Oral  Resp: 20 18 20 18   Height:      Weight:      SpO2: 96% 96% 96% 97%    lungs:   clear to auscultation cor:    RRR Abdomen:  soft, appropriate tenderness, incisions intact and without erythema or exudate ex:    no cords   Lab Results  Component Value Date   WBC 9.7 08/29/2011   HGB 10.6* 08/29/2011   HCT 31.9* 08/29/2011   MCV 93.5 08/29/2011   PLT 149* 08/29/2011    --/--/A POS (07/30 0450)/RI  A/P    Post operative day 1.  Routine post op and postpartum care.  Expect d/c tomorrow.  Percocet for pain control.  Baby too small for circ currently per nursery.

## 2011-08-30 NOTE — Progress Notes (Signed)
POD#2 Pt without c/o. Adequate pain control. Tolerating diet. VSSAF IMP/ Stable Plan/ Probable discharge in am.                                   vssaf

## 2011-08-31 MED ORDER — OXYCODONE-ACETAMINOPHEN 5-325 MG PO TABS: 1.0000 | ORAL_TABLET | ORAL | Status: AC | PRN

## 2011-08-31 NOTE — Discharge Summary (Signed)
Obstetric Discharge Summary Reason for Admission: onset of labor Prenatal Procedures: ultrasound, progesterone therapy for h/o previous preterm delivery. Intrapartum Procedures: cesarean: low cervical, transverse Postpartum Procedures: none Complications-Operative and Postpartum: none Hemoglobin  Date Value Range Status  08/29/2011 10.6* 12.0 - 15.0 g/dL Final     HCT  Date Value Range Status  08/29/2011 31.9* 36.0 - 46.0 % Final    Physical Exam:  General: alert Lochia: appropriate Uterine Fundus: firm Incision: healing well DVT Evaluation: No evidence of DVT seen on physical exam.  Discharge Diagnoses: Term Pregnancy-delivered, Repeat cesarean section  Discharge Information: Date: 08/31/2011 Activity: Limited Diet: routine Medications: PNV, Ibuprofen and Percocet Condition: stable Instructions: refer to practice specific booklet Discharge to: home Follow-up Information    Follow up with CALLAHAN, SIDNEY, DO.   Contact information:   [address] [facility] [place] [address] [phone]          Newborn Data: Live born female  Birth Weight: 4 lb 11.7 oz (2145 g) APGAR: 9, 9  Home with mother.  , D 08/31/2011, 10:39 AM

## 2011-09-04 ENCOUNTER — Ambulatory Visit (HOSPITAL_COMMUNITY)
Admission: RE | Admit: 2011-09-04 | Discharge: 2011-09-04 | Disposition: A | Discharge status: Home / Self Care | Payer: PRIVATE HEALTH INSURANCE | Source: Ambulatory Visit | Attending: Obstetrics and Gynecology | Admitting: Obstetrics and Gynecology

## 2011-09-13 ENCOUNTER — Other Ambulatory Visit (HOSPITAL_COMMUNITY): Payer: PRIVATE HEALTH INSURANCE

## 2011-09-20 ENCOUNTER — Inpatient Hospital Stay (HOSPITAL_COMMUNITY): Admission: AD | Admit: 2011-09-20 | Payer: Self-pay | Source: Ambulatory Visit | Admitting: Obstetrics and Gynecology

## 2011-09-20 SURGERY — Surgical Case
Anesthesia: Regional

## 2012-03-13 IMAGING — US US RENAL
1 series · 14 of 25 positions shown · non-contrast
Comparison: None.

CLINICAL DATA: Hematuria.  Recent urinary tract infection.

RENAL/URINARY TRACT ULTRASOUND COMPLETE 05/11/2010:

[Series 1: us renal · 0.27mm/px · 14 of 30 slices shown]
[im 1/30]
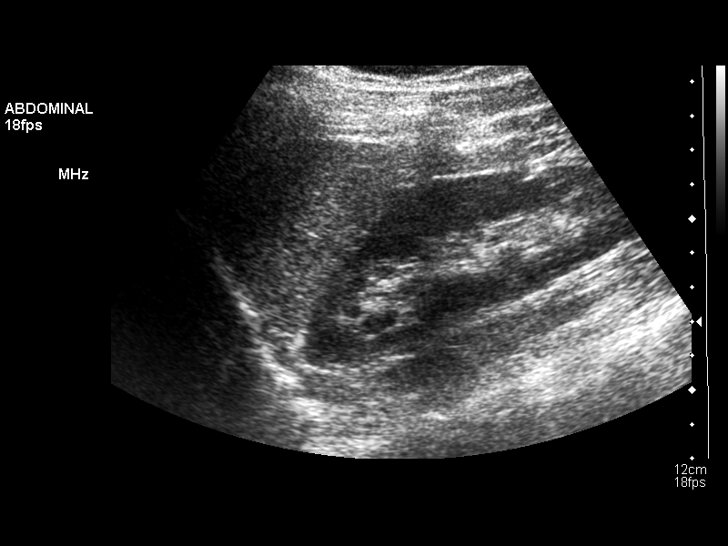
[im 3/30]
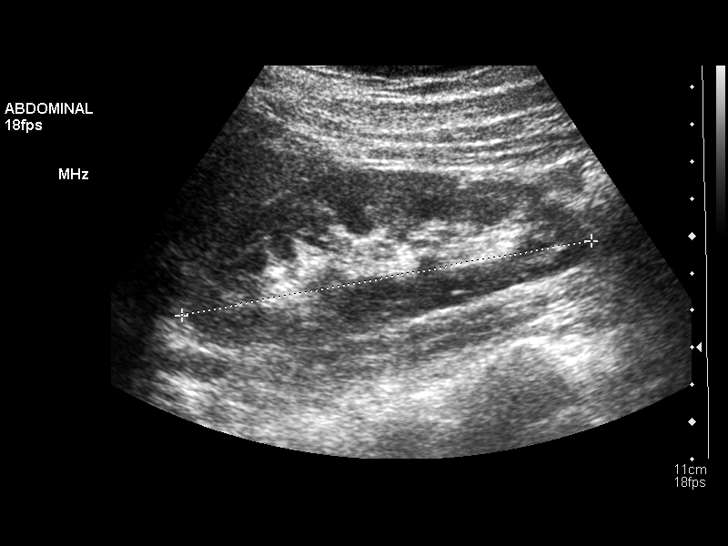
[im 5/30]
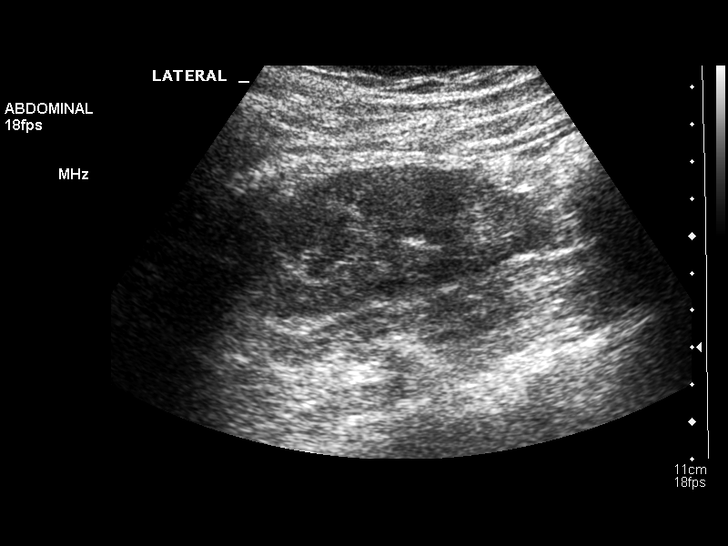
[im 8/30]
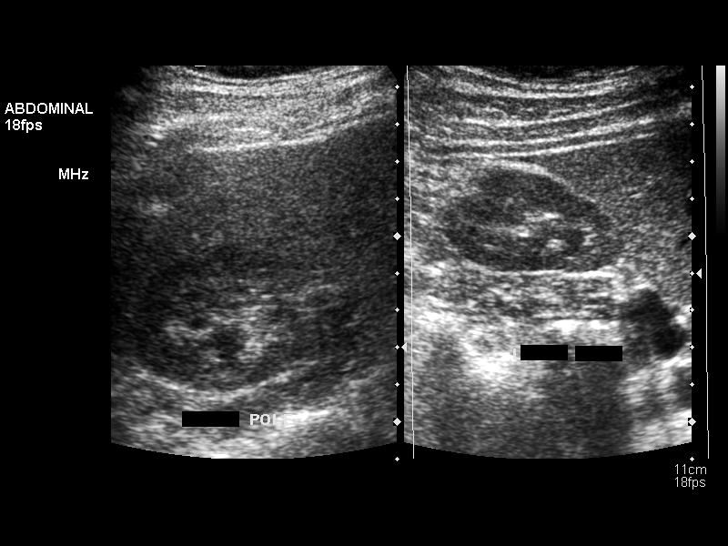
[im 10/30]
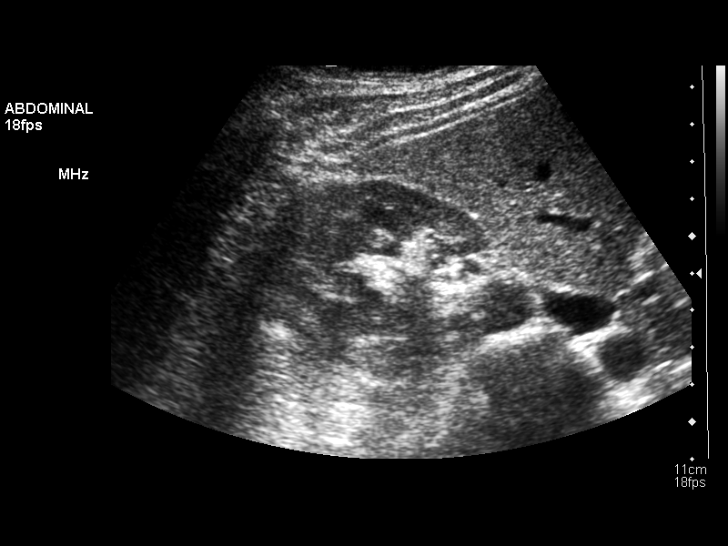
[im 11/30]
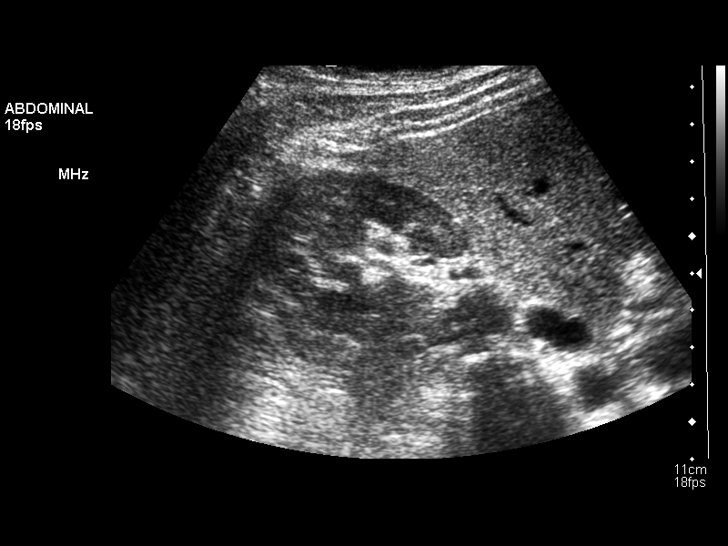
[im 14/30]
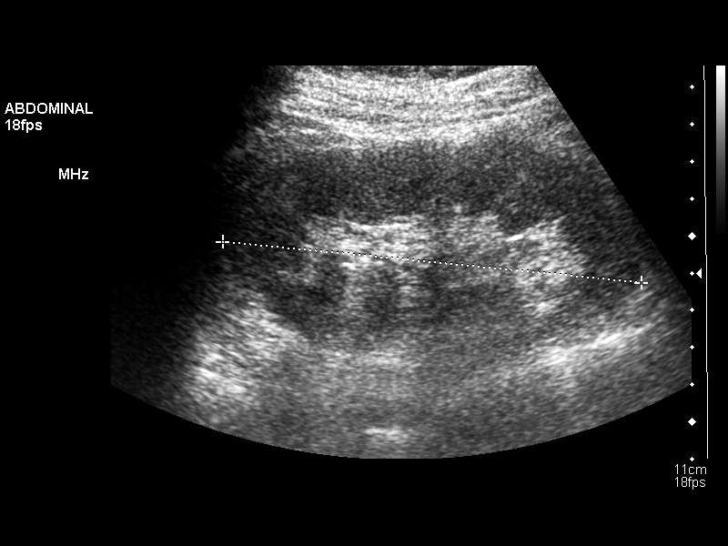
[im 16/30]
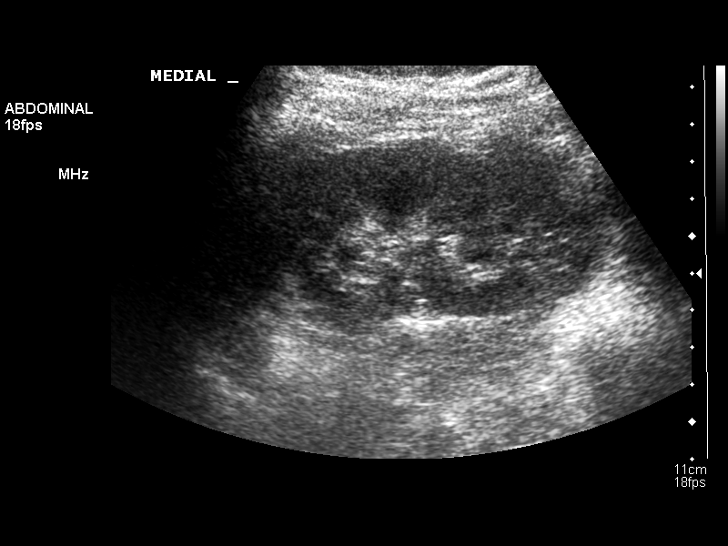
[im 19/30]
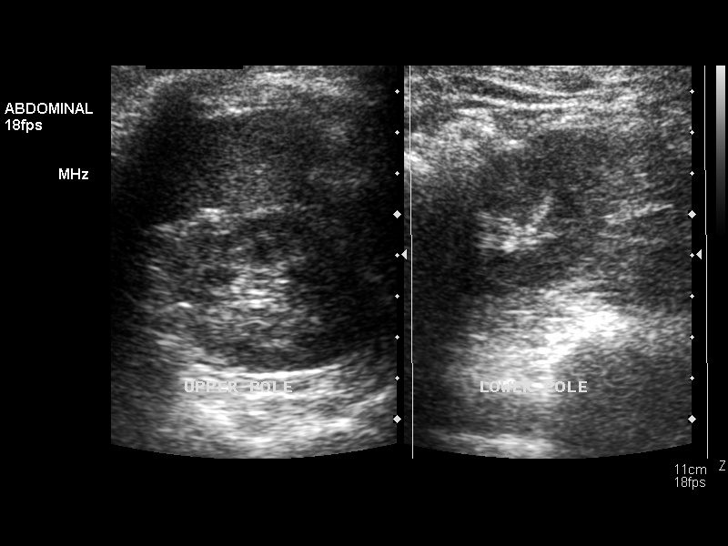
[im 20/30]
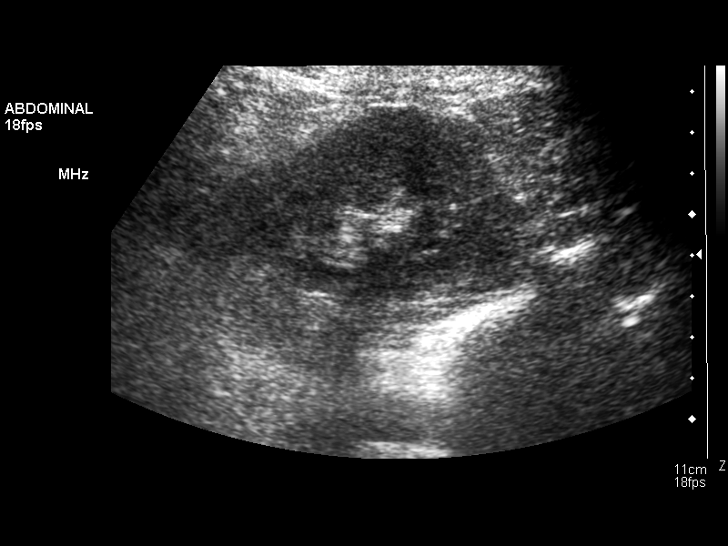
[im 22/30]
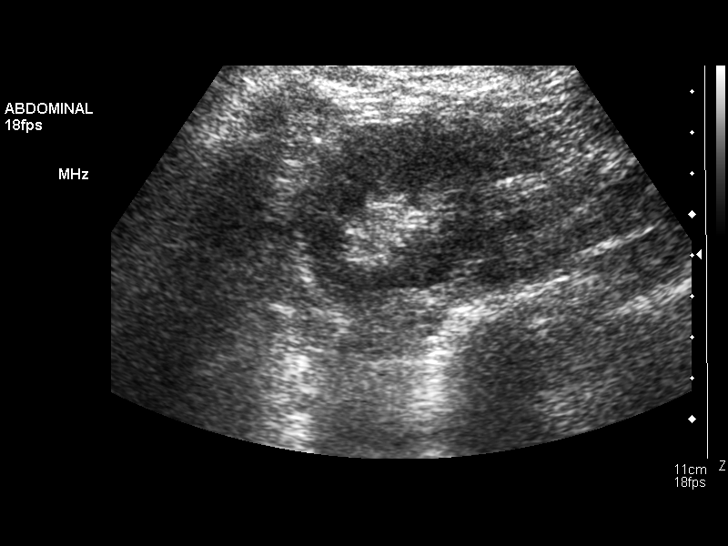
[im 25/30]
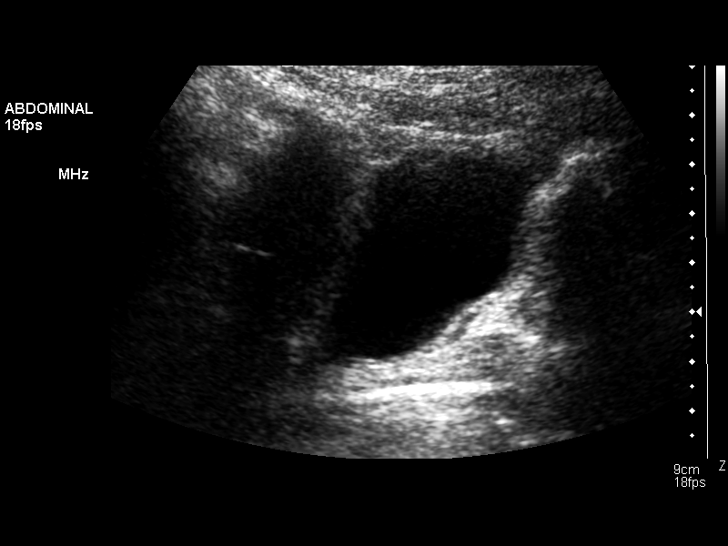
[im 27/30]
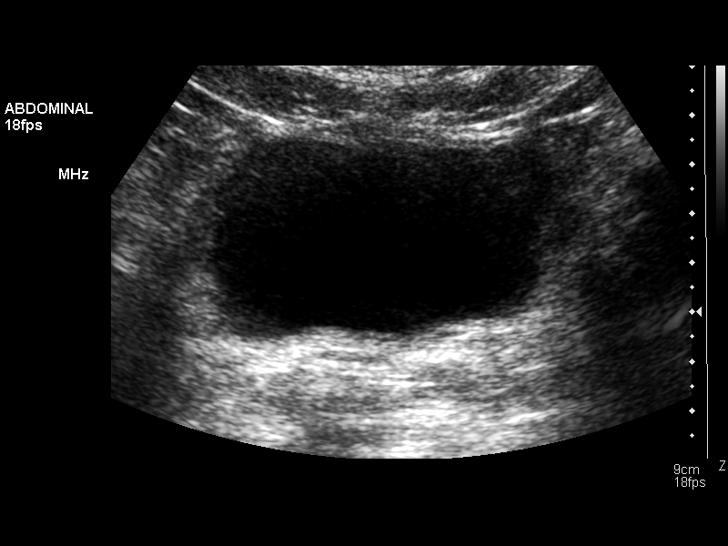
[im 30/30]
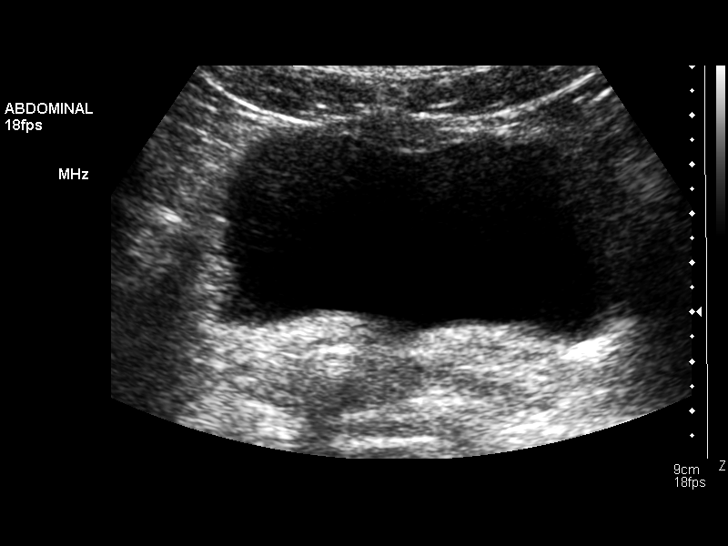

[14 of 25 positions shown; findings below may reference images not displayed]

FINDINGS: Right Kidney:  No hydronephrosis.  Well-preserved cortex.  No
shadowing calculi.  Normal size and parenchymal echotexture without
focal abnormalities.  Approximately 11.2 cm length.

Left Kidney:  No hydronephrosis.  Well-preserved cortex.  No
shadowing calculi.  Normal size and parenchymal echotexture without
focal abnormalities.  Approximately 11.3 cm length.

Bladder:  Relatively decompressed and normal in appearance.
IMPRESSION: Normal urinary tract ultrasound.

## 2012-06-24 ENCOUNTER — Ambulatory Visit: Payer: Self-pay | Admitting: General Practice

## 2013-01-26 ENCOUNTER — Emergency Department: Payer: Self-pay | Admitting: Emergency Medicine

## 2013-02-10 ENCOUNTER — Ambulatory Visit: Payer: Self-pay | Admitting: General Practice

## 2013-02-10 IMAGING — US US RENAL KIDNEY
1 series · 14 of 25 positions shown · non-contrast
Comparison: None.

CLINICAL DATA: Left lower quadrant pain.  Hematuria.

EXAM:
RENAL/URINARY TRACT ULTRASOUND COMPLETE

[Series 1: us renal kidney · 0.21mm/px · 14 of 37 slices shown]
[im 1/37]
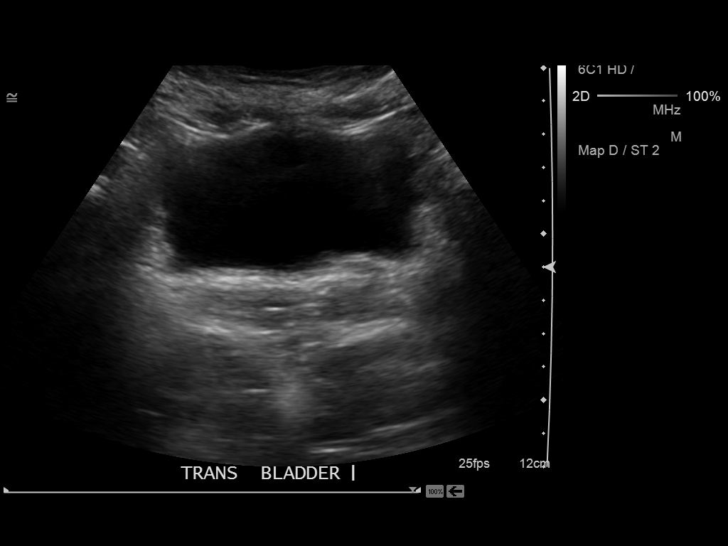
[im 4/37]
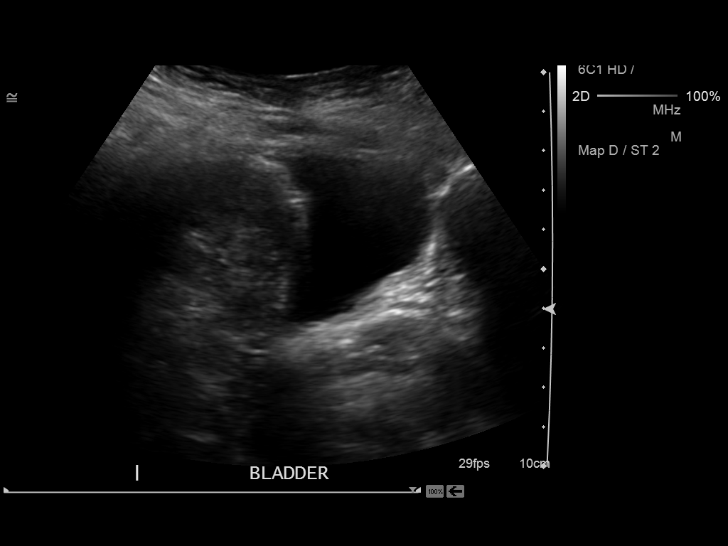
[im 7/37]
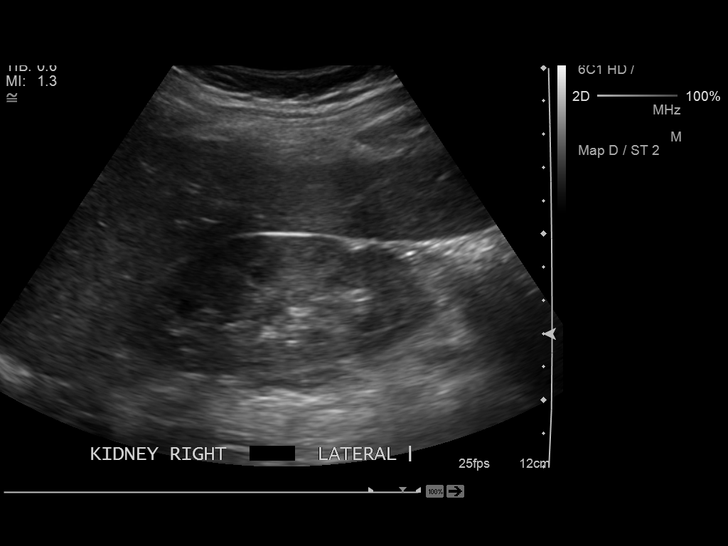
[im 10/37]
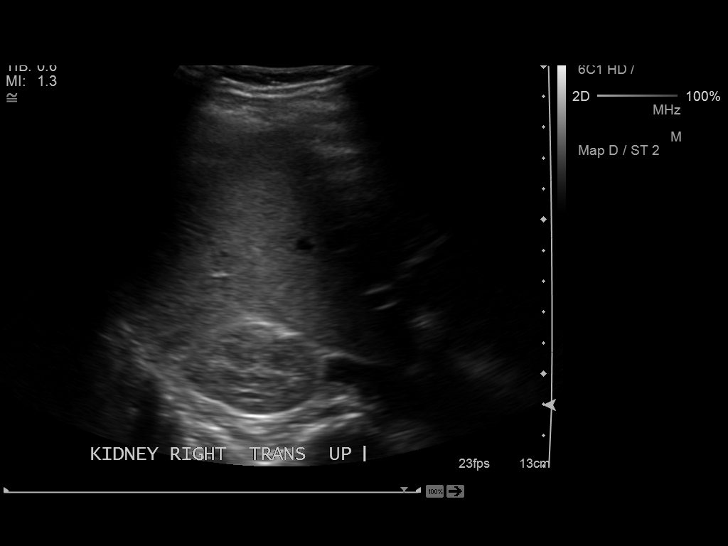
[im 13/37]
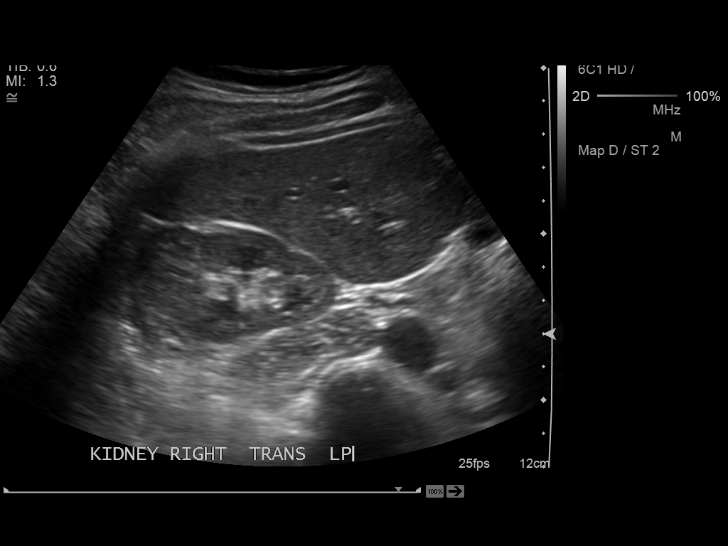
[im 14/37]
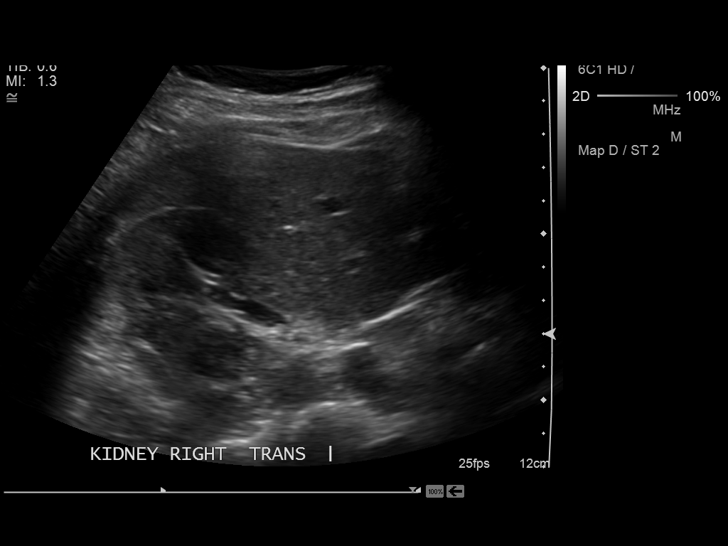
[im 17/37]
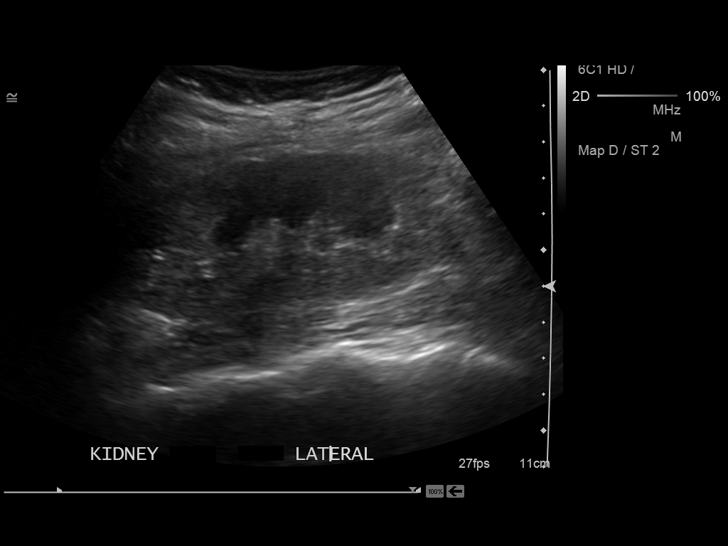
[im 20/37]
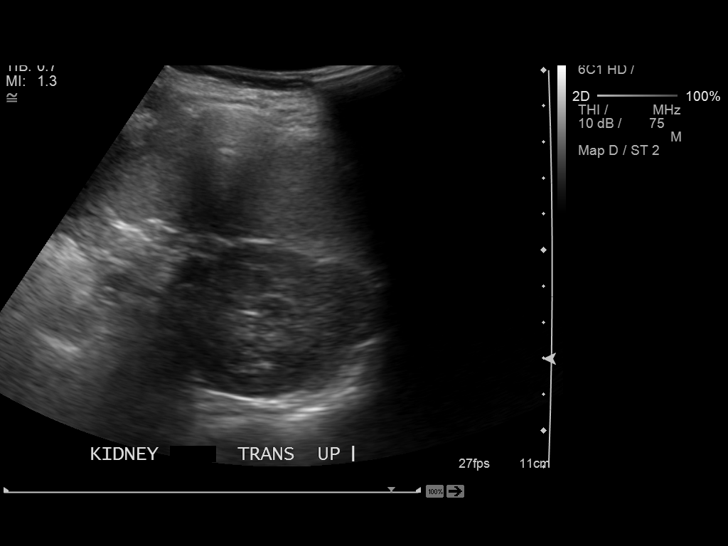
[im 23/37]
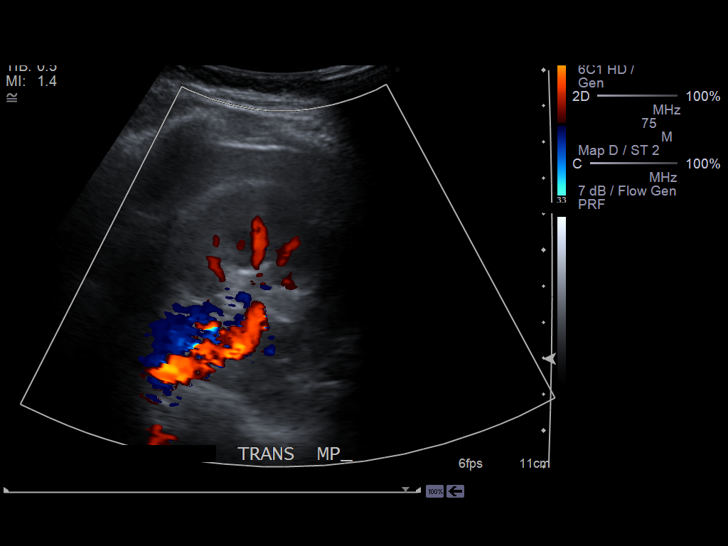
[im 25/37]
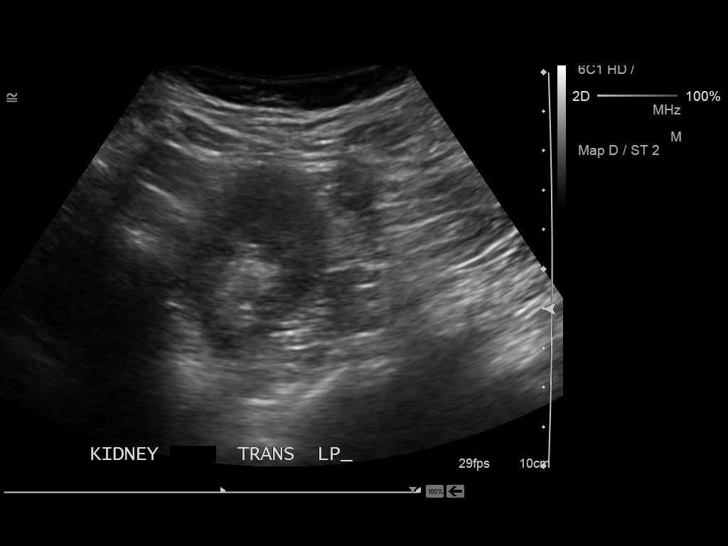
[im 28/37]
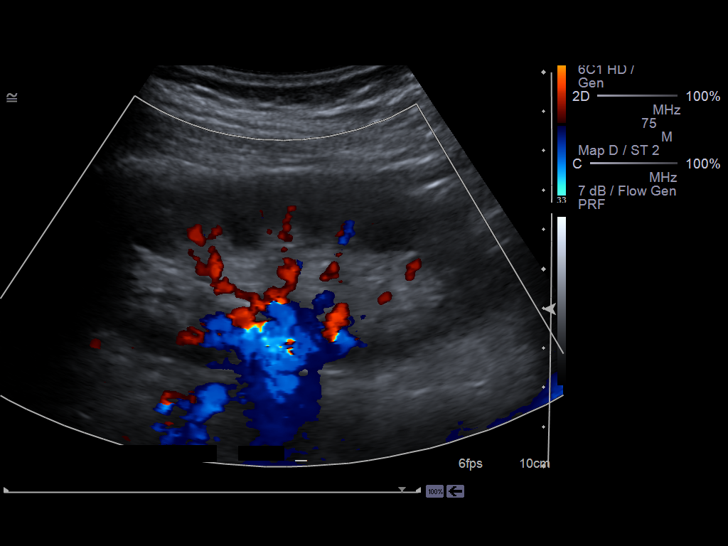
[im 31/37]
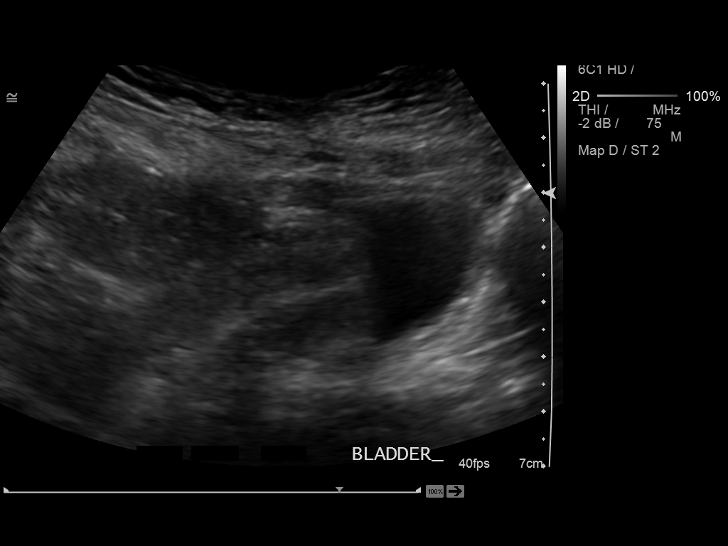
[im 34/37]
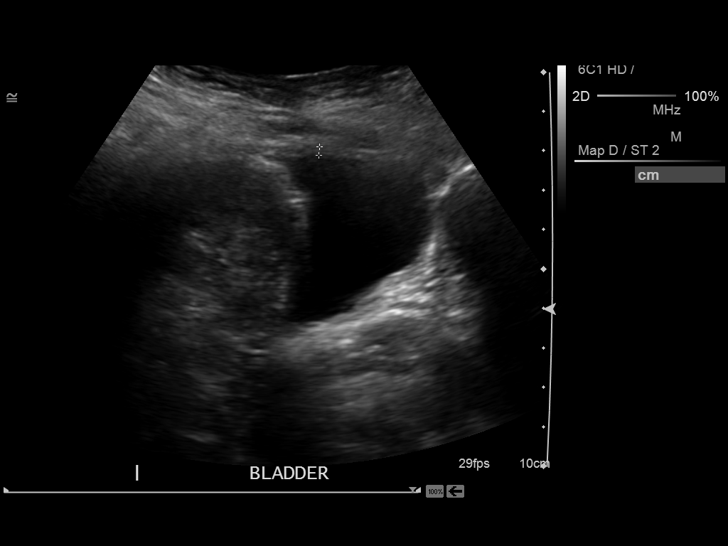
[im 37/37]
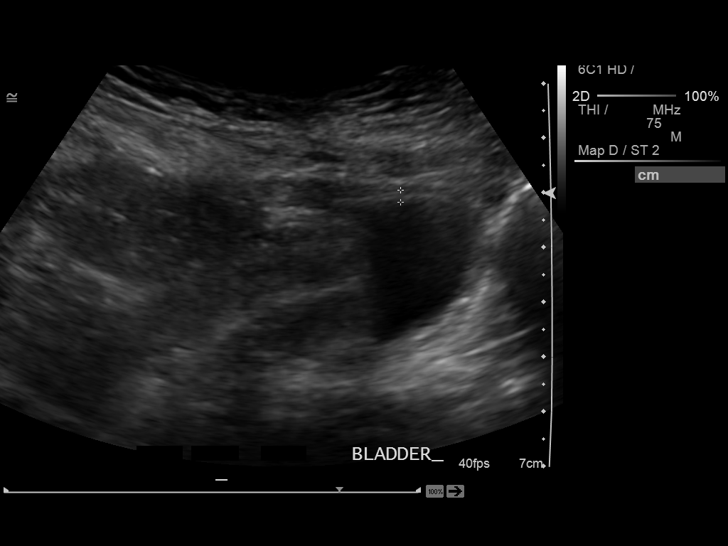

[14 of 25 positions shown; findings below may reference images not displayed]

FINDINGS: Right Kidney:

Length: 11.2 cm. Echogenicity within normal limits. No mass or
hydronephrosis visualized.

Left Kidney:

Length: 11.8 cm. Echogenicity within normal limits. No mass or
hydronephrosis visualized.

Bladder:

Appears normal for degree of bladder distention. The bladder volume
pre voiding is 53 mL. Postvoid volume 12 mL
IMPRESSION: Negative

## 2013-11-30 ENCOUNTER — Encounter (HOSPITAL_COMMUNITY): Payer: Self-pay | Admitting: Obstetrics and Gynecology

## 2013-12-30 ENCOUNTER — Other Ambulatory Visit: Payer: Self-pay | Admitting: Obstetrics and Gynecology

## 2013-12-31 LAB — CYTOLOGY - PAP

## 2014-02-03 ENCOUNTER — Other Ambulatory Visit: Payer: Self-pay | Admitting: Obstetrics and Gynecology

## 2014-11-12 ENCOUNTER — Ambulatory Visit: Payer: Self-pay | Admitting: Physician Assistant

## 2014-11-12 ENCOUNTER — Encounter: Payer: Self-pay | Admitting: Physician Assistant

## 2014-11-12 VITALS — BP 100/58 | HR 86 | Temp 98.3°F

## 2014-11-12 DIAGNOSIS — J069 Acute upper respiratory infection, unspecified: Secondary | ICD-10-CM

## 2014-11-12 MED ORDER — METHYLPREDNISOLONE 4 MG PO TBPK: ORAL_TABLET | ORAL | Status: DC

## 2014-11-12 MED ORDER — BENZONATATE 100 MG PO CAPS: 200.0000 mg | ORAL_CAPSULE | Freq: Three times a day (TID) | ORAL | Status: DC

## 2014-11-12 MED ORDER — CEFDINIR 300 MG PO CAPS: 300.0000 mg | ORAL_CAPSULE | Freq: Two times a day (BID) | ORAL | Status: DC

## 2014-11-12 NOTE — Progress Notes (Signed)
S: C/o cough and congestion with wheezing at night;  Has low grade fever on and off,  mucus is green, or cough is dry and hacking; keeping pt awake at night;  denies cardiac type chest pain or sob, v/d, abd pain; Remainder ros neg; sx for 3 weeks, usign flonase, inhaler, otc meds without relief  O: vitals wnl, nad, tms clear, throat injected, neck supple no lymph, lungs with wheezing, cv rrr, neuro intact  A:  Acute upper respirtatory infection  P:  rx medication:  omnicef 300mg  bid, medrol dose pack, tessalon perls ;  use otc meds, tylenol or motrin as needed for fever/chills, return if not better in 3 -5 days, return earlier if worsening, consider cxr if not better in 1 week

## 2015-04-11 ENCOUNTER — Ambulatory Visit: Payer: Self-pay | Admitting: Physician Assistant

## 2015-04-11 ENCOUNTER — Encounter: Payer: Self-pay | Admitting: Physician Assistant

## 2015-04-11 VITALS — BP 104/62 | HR 90 | Temp 98.3°F

## 2015-04-11 DIAGNOSIS — R300 Dysuria: Secondary | ICD-10-CM

## 2015-04-11 DIAGNOSIS — N390 Urinary tract infection, site not specified: Secondary | ICD-10-CM

## 2015-04-11 LAB — POCT URINALYSIS DIPSTICK
BILIRUBIN UA: NEGATIVE
Glucose, UA: NEGATIVE
KETONES UA: NEGATIVE
LEUKOCYTES UA: NEGATIVE
Nitrite, UA: POSITIVE
PH UA: 6
PROTEIN UA: NEGATIVE
Urobilinogen, UA: 0.2

## 2015-04-11 MED ORDER — FLUCONAZOLE 150 MG PO TABS: ORAL_TABLET | ORAL | Status: DC

## 2015-04-11 MED ORDER — CIPROFLOXACIN HCL 250 MG PO TABS: 250.0000 mg | ORAL_TABLET | Freq: Two times a day (BID) | ORAL | Status: DC

## 2015-04-11 NOTE — Progress Notes (Signed)
S:  C/o uti sx for 2 days, low back pain with urgency, denies vaginal discharge, abdominal pain or flank pain:  Remainder ros neg  O:  Vitals wnl, nad, no cva tenderness, back nontender, lungs c t a,cv rrr, abd soft nontender, bs normal, n/v intact, ua +nitrites  A: uti  P: cipro 250mg  bid x 7d, increase water intake, add cranberry juice, return if not improving in 2 -3 days, return earlier if worsening, discussed pyelonephritis sx

## 2015-04-13 ENCOUNTER — Other Ambulatory Visit: Payer: Self-pay | Admitting: Physician Assistant

## 2015-04-13 MED ORDER — SULFAMETHOXAZOLE-TRIMETHOPRIM 800-160 MG PO TABS: 1.0000 | ORAL_TABLET | Freq: Two times a day (BID) | ORAL | Status: DC

## 2015-04-13 NOTE — Progress Notes (Signed)
Pt getting abdominal pain with cipro, switched antibiotic to septra

## 2015-04-15 ENCOUNTER — Other Ambulatory Visit: Payer: Self-pay | Admitting: Obstetrics and Gynecology

## 2015-09-06 ENCOUNTER — Ambulatory Visit: Payer: Self-pay | Admitting: Physician Assistant

## 2015-09-06 ENCOUNTER — Encounter: Payer: Self-pay | Admitting: Physician Assistant

## 2015-09-06 VITALS — BP 110/70 | HR 98 | Temp 97.5°F

## 2015-09-06 DIAGNOSIS — J209 Acute bronchitis, unspecified: Secondary | ICD-10-CM

## 2015-09-06 MED ORDER — LEVOFLOXACIN 500 MG PO TABS: 500.0000 mg | ORAL_TABLET | Freq: Every day | ORAL | 0 refills | Status: DC

## 2015-09-06 MED ORDER — FLUTICASONE-SALMETEROL 115-21 MCG/ACT IN AERO: 2.0000 | INHALATION_SPRAY | Freq: Two times a day (BID) | RESPIRATORY_TRACT | 12 refills | Status: DC

## 2015-09-06 MED ORDER — HYDROCOD POLST-CPM POLST ER 10-8 MG/5ML PO SUER: 5.0000 mL | Freq: Two times a day (BID) | ORAL | 0 refills | Status: DC | PRN

## 2015-09-06 MED ORDER — METHYLPREDNISOLONE 4 MG PO TBPK: ORAL_TABLET | ORAL | 0 refills | Status: DC

## 2015-09-06 NOTE — Progress Notes (Signed)
S: C/o cough and congestion with wheezing and chest pain, chest is sore from coughing,denies fever, chills, r cough is dry and hacking; keeping pt awake at night;  denies cardiac type chest pain or sob, v/d, abd pain Remainder ros neg; was seen at [facility] last Thursday 5 days ago, given cefdnir and prednisone 40mg  for 4 days, tried tessalon perls and abluterol inhaler without relief, husband had same sx, got better with advair  O: vitals wnl, nad, tms clear, throat injected, neck supple no lymph, lungs c t a, cv rrr, neuro intact, cough is dry, pt feels clammy  A:  Acute bronchitis   P:  rx medication:  Levaquin, medrol dose pack, advair hfa 115, tussionex 150ml nr; , use otc meds, tylenol or motrin as needed for fever/chills, return if not better in 3 -5 days, return earlier if worsening, can order cxr if not improved by Friday or Monday

## 2015-09-28 ENCOUNTER — Other Ambulatory Visit: Payer: Self-pay | Admitting: Obstetrics and Gynecology

## 2015-09-29 LAB — CYTOLOGY - PAP

## 2016-01-12 ENCOUNTER — Ambulatory Visit: Payer: Self-pay | Admitting: Physician Assistant

## 2016-01-12 ENCOUNTER — Encounter: Payer: Self-pay | Admitting: Physician Assistant

## 2016-01-12 VITALS — BP 105/53 | HR 85 | Temp 98.1°F

## 2016-01-12 DIAGNOSIS — R300 Dysuria: Secondary | ICD-10-CM

## 2016-01-12 LAB — POCT URINALYSIS DIPSTICK
Bilirubin, UA: NEGATIVE
Glucose, UA: NEGATIVE
KETONES UA: NEGATIVE
Leukocytes, UA: NEGATIVE
Nitrite, UA: NEGATIVE
PH UA: 5.5
PROTEIN UA: NEGATIVE
SPEC GRAV UA: 1.02
UROBILINOGEN UA: 0.2

## 2016-01-12 MED ORDER — PHENAZOPYRIDINE HCL 200 MG PO TABS: 200.0000 mg | ORAL_TABLET | Freq: Three times a day (TID) | ORAL | 0 refills | Status: DC | PRN

## 2016-01-12 NOTE — Progress Notes (Signed)
S:  C/o uti sx for 7 days, pelvic pressure, urgency, frequency, denies vaginal discharge, abdominal pain or flank pain, fever/chills:  Remainder ros neg  O:  Vitals wnl, nad, no cva tenderness, back nontender ua wnl  A: dysuria  P: , increase water intake, add cranberry juice, return if not improving in 2 -3 days, return earlier if worsening, discussed pyelonephritis sx, pyridium, urine culture, f/u with gyn

## 2016-01-14 LAB — URINE CULTURE: Organism ID, Bacteria: NO GROWTH

## 2022-02-02 ENCOUNTER — Encounter: Payer: Self-pay | Admitting: Nurse Practitioner

## 2022-02-02 ENCOUNTER — Ambulatory Visit: Payer: No Typology Code available for payment source | Admitting: Nurse Practitioner

## 2022-02-02 VITALS — BP 102/60 | HR 86 | Temp 98.5°F | Ht 61.0 in | Wt 119.8 lb

## 2022-02-02 DIAGNOSIS — R251 Tremor, unspecified: Secondary | ICD-10-CM

## 2022-02-02 DIAGNOSIS — K219 Gastro-esophageal reflux disease without esophagitis: Secondary | ICD-10-CM | POA: Diagnosis not present

## 2022-02-02 DIAGNOSIS — Z0000 Encounter for general adult medical examination without abnormal findings: Secondary | ICD-10-CM | POA: Diagnosis not present

## 2022-02-02 DIAGNOSIS — H9190 Unspecified hearing loss, unspecified ear: Secondary | ICD-10-CM | POA: Insufficient documentation

## 2022-02-02 LAB — COMPREHENSIVE METABOLIC PANEL
ALT: 12 U/L (ref 0–35)
AST: 15 U/L (ref 0–37)
Albumin: 4.3 g/dL (ref 3.5–5.2)
Alkaline Phosphatase: 46 U/L (ref 39–117)
BUN: 15 mg/dL (ref 6–23)
CO2: 29 mEq/L (ref 19–32)
Calcium: 9 mg/dL (ref 8.4–10.5)
Chloride: 101 mEq/L (ref 96–112)
Creatinine, Ser: 0.68 mg/dL (ref 0.40–1.20)
GFR: 106.04 mL/min (ref >60.00)
Glucose, Bld: 91 mg/dL (ref 70–99)
Potassium: 4.1 mEq/L (ref 3.5–5.1)
Sodium: 137 mEq/L (ref 135–145)
Total Bilirubin: 0.4 mg/dL (ref 0.2–1.2)
Total Protein: 6.8 g/dL (ref 6.0–8.3)

## 2022-02-02 LAB — LIPID PANEL
Cholesterol: 215 mg/dL — ABNORMAL HIGH (ref 0–200)
HDL: 56.4 mg/dL (ref >39.00)
LDL Cholesterol: 124 mg/dL — ABNORMAL HIGH (ref 0–99)
NonHDL: 158.42
Total CHOL/HDL Ratio: 4
Triglycerides: 174 mg/dL — ABNORMAL HIGH (ref 0.0–149.0)
VLDL: 34.8 mg/dL (ref 0.0–40.0)

## 2022-02-02 LAB — CBC WITH DIFFERENTIAL/PLATELET
Basophils Absolute: 0 10*3/uL (ref 0.0–0.1)
Basophils Relative: 0.5 % (ref 0.0–3.0)
Eosinophils Absolute: 0 10*3/uL (ref 0.0–0.7)
Eosinophils Relative: 0.8 % (ref 0.0–5.0)
HCT: 37.9 % (ref 36.0–46.0)
Hemoglobin: 12.9 g/dL (ref 12.0–15.0)
Lymphocytes Relative: 27.6 % (ref 12.0–46.0)
Lymphs Abs: 1.5 10*3/uL (ref 0.7–4.0)
MCHC: 34 g/dL (ref 30.0–36.0)
MCV: 88.6 fl (ref 78.0–100.0)
Monocytes Absolute: 0.4 10*3/uL (ref 0.1–1.0)
Monocytes Relative: 7.5 % (ref 3.0–12.0)
Neutro Abs: 3.5 10*3/uL (ref 1.4–7.7)
Neutrophils Relative %: 63.6 % (ref 43.0–77.0)
Platelets: 269 10*3/uL (ref 150.0–400.0)
RBC: 4.27 Mil/uL (ref 3.87–5.11)
RDW: 12.8 % (ref 11.5–15.5)
WBC: 5.5 10*3/uL (ref 4.0–10.5)

## 2022-02-02 LAB — TSH: TSH: 2.18 u[IU]/mL (ref 0.35–5.50)

## 2022-02-02 LAB — VITAMIN D 25 HYDROXY (VIT D DEFICIENCY, FRACTURES): VITD: 30.01 ng/mL (ref 30.00–100.00)

## 2022-02-02 LAB — HEMOGLOBIN A1C: Hgb A1c MFr Bld: 5.4 % (ref 4.6–6.5)

## 2022-02-02 MED ORDER — OMEPRAZOLE 40 MG PO CPDR: 40.0000 mg | DELAYED_RELEASE_CAPSULE | Freq: Every day | ORAL | 3 refills | Status: AC

## 2022-02-02 NOTE — Assessment & Plan Note (Addendum)
Stable on Omeprazole 40mg  daily. Will refill today. Continue to monitor, if symptoms worsening will proceed with GI referral/EGD.

## 2022-02-02 NOTE — Assessment & Plan Note (Signed)
Physical exam complete. Labs as outlined. Will contact with results. Patient UTD on health maintenance. Encouraged follow up with Gyn and her Psychiatrist. Dental exam on Monday as scheduled.

## 2022-02-02 NOTE — Assessment & Plan Note (Addendum)
Could be anxiety related. Slight tremor noted when hands raised, not while resting. Will check labs today. Encouraged her to mention to her Psychiatrist as well. Patient declined referral to Neuro today. Will continue to monitor.

## 2022-02-02 NOTE — Progress Notes (Signed)
Bethanie Dicker, NP-C Phone: [phone]  Misty Lowe is a 47 y.o. female who presents today to establish care. Patient reports only concern is a hand tremor that she has had for at least a year, but has gotten worse over the last few months. She is currently going through a traumatic separation from her husband. She has recently started seeing a Psychiatrist, which she has an appointment with this afternoon. 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   Diet: Well, eating smaller meals, reports occasional sweet tooth. Exercise: Used to walk and run, has not been recently, trying to get back into it.  Pap smear: July 2023 Mammogram: July 2023 Family history-  Colon cancer: No  Breast cancer: No  Ovarian cancer: No Vaccines-   Flu: UTD  Tetanus: UTD  COVID19: x 3 HIV screening: Declined Hep C Screening: Declined Tobacco use: No Alcohol use: Rarely Illicit Drug use: No Dentist: Yes, appointment in 3 days Ophthalmology: Yes, was seen this week.   GERD:   Reflux symptoms: Heartburn, dry cough   Abd pain: No   Blood in stool: No  Dysphagia: No   EGD: 15+ years ago, Hx of hiatal hernia  Medication: Omeprazole 40 daily  Hand tremor- Patient believes could be due to anxiety and recent separation has made worse. She used to believe it was due to hypoglycemia but it is not relieved with eating. Denies weakness or numbness and tingling in hands.   Active Ambulatory Problems    Diagnosis Date Noted   Preventative health care 02/02/2022   Gastroesophageal reflux disease without esophagitis 02/02/2022   Tremor of both hands 02/02/2022   Hearing impairment 02/02/2022   Resolved Ambulatory Problems    Diagnosis Date Noted   No Resolved Ambulatory Problems   Past Medical History:  Diagnosis Date   Hiatal hernia    PONV (postoperative nausea and vomiting)     Family History  Problem Relation Age  of Onset   Other Neg Hx     Social History   Socioeconomic History   Marital status: Married    Spouse name: Not on file   Number of children: Not on file   Years of education: Not on file   Highest education level: Not on file  Occupational History   Not on file  Tobacco Use   Smoking status: Never   Smokeless tobacco: Not on file  Substance and Sexual Activity   Alcohol use: No   Drug use: No   Sexual activity: Yes  Other Topics Concern   Not on file  Social History Narrative   Not on file   Social Determinants of Health   Financial Resource Strain: Not on file  Food Insecurity: Not on file  Transportation Needs: Not on file  Physical Activity: Not on file  Stress: Not on file  Social Connections: Not on file  Intimate Partner Violence: Not on file    ROS  General:  Negative for nexplained weight loss, fever Skin: Negative for new or changing mole, sore that won't heal HEENT: Negative for trouble seeing, ringing in ears, mouth sores, hoarseness, change in voice, dysphagia. CV:  Negative for chest pain, dyspnea, edema, palpitations Resp: Negative for cough, dyspnea, hemoptysis GI: Negative for nausea, vomiting, diarrhea, constipation, abdominal pain, melena, hematochezia. GU: Negative for dysuria, incontinence, urinary hesitance, hematuria, vaginal or penile discharge, polyuria, sexual difficulty, lumps in testicle or breasts MSK: Negative for muscle cramps or aches,  joint pain or swelling Neuro: Negative for headaches, weakness, numbness, dizziness, passing out/fainting Psych: Negative for memory problems  Objective  Physical Exam Vitals:   02/02/22 1030  BP: 102/60  Pulse: 86  Temp: 98.5 F (36.9 C)  SpO2: 98%    BP Readings from Last 3 Encounters:  02/02/22 102/60  01/12/16 (!) 105/53  09/06/15 110/70   Wt Readings from Last 3 Encounters:  02/02/22 119 lb 12.8 oz (54.3 kg)  08/27/11 161 lb (73 kg)    Physical Exam Constitutional:       General: She is not in acute distress.    Appearance: Normal appearance.  HENT:     Head: Normocephalic.     Left Ear: Tympanic membrane normal.     Ears:     Comments: Bilateral hearing aids in place. Right ear deformity noted- external ear and canal. Unable to visualize right TM.     Nose: Nose normal.     Mouth/Throat:     Mouth: Mucous membranes are moist.     Pharynx: Oropharynx is clear.  Eyes:     Pupils: Pupils are equal, round, and reactive to light.  Cardiovascular:     Rate and Rhythm: Normal rate and regular rhythm.     Heart sounds: Normal heart sounds.  Pulmonary:     Effort: Pulmonary effort is normal.     Breath sounds: Normal breath sounds.  Abdominal:     General: Abdomen is flat. Bowel sounds are normal.     Palpations: Abdomen is soft. There is no mass.     Tenderness: There is no abdominal tenderness.  Musculoskeletal:        General: Normal range of motion.  Skin:    General: Skin is warm and dry.  Neurological:     General: No focal deficit present.     Mental Status: She is alert and oriented to person, place, and time.     Sensory: No sensory deficit.     Motor: No weakness.     Coordination: Coordination normal.     Gait: Gait normal.     Deep Tendon Reflexes: Reflexes normal.     Comments: Tremor seems to be with action, not while at rest. Slight tremor in bilateral hands noted when hands are raised.   Psychiatric:        Mood and Affect: Mood normal.        Behavior: Behavior normal.        Thought Content: Thought content normal.    Assessment/Plan:   Preventative health care Assessment & Plan: Physical exam complete. Labs as outlined. Will contact with results. Patient UTD on health maintenance. Encouraged follow up with Gyn and her Psychiatrist. Dental exam on Monday as scheduled.   Orders: -     CBC with Differential/Platelet -     Comprehensive metabolic panel -     TSH -     Hemoglobin A1c -     Lipid panel -     VITAMIN D 25  Hydroxy (Vit-D Deficiency, Fractures)  Gastroesophageal reflux disease without esophagitis Assessment & Plan: Stable on Omeprazole 40mg  daily. Will refill today. Continue to monitor, if symptoms worsening will proceed with GI referral/EGD.   Orders: -     Omeprazole; Take 1 capsule (40 mg total) by mouth daily.  Dispense: 90 capsule; Refill: 3  Tremor of both hands Assessment & Plan: Could be anxiety related. Slight tremor noted when hands raised, not while resting. Will check labs today. Encouraged  her to mention to her Psychiatrist as well. Patient declined referral to Neuro today. Will continue to monitor.     Return in about 1 year (around 02/03/2023).   Tomasita Morrow, NP-C [place]

## 2022-03-26 NOTE — Progress Notes (Unsigned)
Tomasita Morrow, NP-C Phone: [phone]  Misty Lowe is a 47 y.o. female who presents today for cough and congestion.  Patient reports her symptoms began 5 days ago. She has some left ear fullness and sinus pressure.   Respiratory illness:  Cough- Yes, non productive  Congestion-    Sinus- Yes, nasal, sinus pain and pressure   Chest- No  Post nasal drip- Yes  Sore throat- Yes  Shortness of breath- No  Fever- No  Fatigue/Myalgia- Yes Headache- Yes Nausea/Vomiting- No Taste disturbance- No  Smell disturbance- No  Covid exposure- No  Covid vaccination- x 4  Flu vaccination- UTD  Medications- Vitamin C, OTC cough and cold medications, Sudafed, Nyquil  Social History   Tobacco Use  Smoking Status Never  Smokeless Tobacco Not on file    Current Outpatient Medications on File Prior to Visit  Medication Sig Dispense Refill   venlafaxine XR (EFFEXOR-XR) 75 MG 24 hr capsule Take 75 mg by mouth daily. Pt takes 112.'5mg'$      albuterol (VENTOLIN HFA) 108 (90 Base) MCG/ACT inhaler Inhale 1-2 puffs into the lungs every 6 (six) hours as needed for wheezing.     BLISOVI FE 1/20 1-20 MG-MCG tablet Take 1 tablet by mouth daily.     Melatonin 10 MG TABS Take 1 tablet by mouth as needed (at bed time for sleep (OTC)).     omeprazole (PRILOSEC) 40 MG capsule Take 1 capsule (40 mg total) by mouth daily. 90 capsule 3   No current facility-administered medications on file prior to visit.   ROS see history of present illness  Objective  Physical Exam Vitals:   03/27/22 0903  BP: 118/68  Pulse: 93  Temp: 98.2 F (36.8 C)  SpO2: 96%    BP Readings from Last 3 Encounters:  03/27/22 118/68  02/02/22 102/60  01/12/16 (!) 105/53   Wt Readings from Last 3 Encounters:  03/27/22 115 lb 6.4 oz (52.3 kg)  02/02/22 119 lb 12.8 oz (54.3 kg)  08/27/11 161 lb (73 kg)    Physical Exam Constitutional:      General: She is not in acute distress.    Appearance: Normal appearance.   HENT:     Head: Normocephalic.     Right Ear: Tympanic membrane normal.     Left Ear: Tympanic membrane normal.     Ears:     Comments: Right ear deformity- external ear and canal    Nose: Congestion present.     Right Sinus: Maxillary sinus tenderness present.     Left Sinus: Maxillary sinus tenderness present.     Mouth/Throat:     Mouth: Mucous membranes are moist.     Pharynx: Oropharynx is clear. Posterior oropharyngeal erythema present.  Eyes:     Conjunctiva/sclera: Conjunctivae normal.     Pupils: Pupils are equal, round, and reactive to light.  Cardiovascular:     Rate and Rhythm: Normal rate and regular rhythm.     Heart sounds: Normal heart sounds.  Pulmonary:     Effort: Pulmonary effort is normal.     Breath sounds: Normal breath sounds.  Lymphadenopathy:     Cervical: No cervical adenopathy.  Skin:    General: Skin is warm and dry.  Neurological:     General: No focal deficit present.     Mental Status: She is alert.  Psychiatric:        Mood and Affect: Mood normal.        Behavior: Behavior normal.  Assessment/Plan: Please see individual problem list.  Acute non-recurrent maxillary sinusitis Assessment & Plan: Symptoms and exam consistent with sinusitis. Will treat with Doxy 100 BID x 7 days. Counseled on side effects such as diarrhea. Bromfed DM for cough. Counseled on side effects such as drowsiness and to stop other OTC cough/cold medications. Advised adequate fluid intake. She can take Tylenol/Ibuprofen PRN.   Orders: -     Doxycycline Hyclate; Take 1 tablet (100 mg total) by mouth 2 (two) times daily.  Dispense: 14 tablet; Refill: 0 -     Pseudoeph-Bromphen-DM; Take 5-10 mLs by mouth every 6 (six) hours as needed.  Dispense: 120 mL; Refill: 0   Return if symptoms worsen or fail to improve.   Tomasita Morrow, NP-C [place]

## 2022-03-27 ENCOUNTER — Encounter: Payer: Self-pay | Admitting: Nurse Practitioner

## 2022-03-27 ENCOUNTER — Ambulatory Visit (INDEPENDENT_AMBULATORY_CARE_PROVIDER_SITE_OTHER): Payer: No Typology Code available for payment source | Admitting: Nurse Practitioner

## 2022-03-27 VITALS — BP 118/68 | HR 93 | Temp 98.2°F | Ht 61.0 in | Wt 115.4 lb

## 2022-03-27 DIAGNOSIS — J0100 Acute maxillary sinusitis, unspecified: Secondary | ICD-10-CM | POA: Diagnosis not present

## 2022-03-27 MED ORDER — PSEUDOEPH-BROMPHEN-DM 30-2-10 MG/5ML PO SYRP: 5.0000 mL | ORAL_SOLUTION | Freq: Four times a day (QID) | ORAL | 0 refills | Status: DC | PRN

## 2022-03-27 MED ORDER — DOXYCYCLINE HYCLATE 100 MG PO TABS: 100.0000 mg | ORAL_TABLET | Freq: Two times a day (BID) | ORAL | 0 refills | Status: DC

## 2022-03-27 NOTE — Patient Instructions (Signed)
It was nice to see you.  I have sent an antibiotic and cough medicine to your pharmacy.  Make sure you are drinking plenty of fluids.

## 2022-03-27 NOTE — Assessment & Plan Note (Addendum)
Symptoms and exam consistent with sinusitis. Will treat with Doxy 100 BID x 7 days. Counseled on side effects such as diarrhea. Bromfed DM for cough. Counseled on side effects such as drowsiness and to stop other OTC cough/cold medications. Advised adequate fluid intake. She can take Tylenol/Ibuprofen PRN.

## 2022-05-04 NOTE — Progress Notes (Signed)
Bethanie Dicker, NP-C Phone: [phone]  Misty Lowe is a 47 y.o. female who presents today for abdominal pain.   Abdominal Pain  She reports chronic abdominal pain that is gradually worsening. The abdominal pain is located in the epigastrium and does not radiate. It is described as cramping and pressure-like, is 4/10 in intensity, occurring intermittently. It is aggravated by moving and lifting  and is relieved by being still. She has tried PPIs with mild relief.  Associated symptoms: No anorexia  No belching  Yes bloody stool No blood in urine   Yes constipation No diarrhea  No dysuria No fever  No flatus No headaches  No headaches No joint pains  No myalgias Yes nausea  No vomiting No weight loss   Patient reports she has had intermittent blood in her stools and with wiping for the past year. She has never been evaluated for this. Denies fevers   Recent GI studies: None Relevant medical history includes: Hiatal hernia  Sexually Transmitted Disease Check: Patient presents for sexually transmitted disease check. Sexual history reviewed with the patient. STD exposure: Patient recently split from husband, she has since learned of infidelity that occurred during their marriage. She has also had one new partner since.  Previous history of STD:  none. Current symptoms include none.  Contraception: OCP (estrogen/progesterone) and vasectomy.   Social History   Tobacco Use  Smoking Status Never  Smokeless Tobacco Not on file    Current Outpatient Medications on File Prior to Visit  Medication Sig Dispense Refill   albuterol (VENTOLIN HFA) 108 (90 Base) MCG/ACT inhaler Inhale 1-2 puffs into the lungs every 6 (six) hours as needed for wheezing.     BLISOVI FE 1/20 1-20 MG-MCG tablet Take 1 tablet by mouth daily.     brompheniramine-pseudoephedrine-DM 30-2-10 MG/5ML syrup Take 5-10 mLs by mouth every 6 (six) hours as needed. 120 mL 0   omeprazole (PRILOSEC) 40 MG capsule Take 1  capsule (40 mg total) by mouth daily. 90 capsule 3   QUEtiapine (SEROQUEL) 25 MG tablet Take 25 mg by mouth at bedtime.     venlafaxine XR (EFFEXOR-XR) 150 MG 24 hr capsule Take 150 mg by mouth every morning.     No current facility-administered medications on file prior to visit.    ROS see history of present illness  Objective  Physical Exam Vitals:   05/08/22 0817  BP: 100/66  Pulse: 90  Temp: 98.3 F (36.8 C)  SpO2: 98%    BP Readings from Last 3 Encounters:  05/08/22 100/66  03/27/22 118/68  02/02/22 102/60   Wt Readings from Last 3 Encounters:  05/08/22 118 lb 3.2 oz (53.6 kg)  03/27/22 115 lb 6.4 oz (52.3 kg)  02/02/22 119 lb 12.8 oz (54.3 kg)    Physical Exam Constitutional:      General: She is not in acute distress.    Appearance: Normal appearance.  HENT:     Head: Normocephalic.     Right Ear: Tympanic membrane normal.     Left Ear: Tympanic membrane normal.     Nose: Nose normal.     Mouth/Throat:     Mouth: Mucous membranes are moist.     Pharynx: Oropharynx is clear.  Eyes:     Conjunctiva/sclera: Conjunctivae normal.     Pupils: Pupils are equal, round, and reactive to light.  Cardiovascular:     Rate and Rhythm: Normal rate and regular rhythm.     Heart sounds: Normal heart sounds.  Pulmonary:     Effort: Pulmonary effort is normal.     Breath sounds: Normal breath sounds.  Abdominal:     General: Abdomen is flat. Bowel sounds are normal. There is no distension.     Palpations: Abdomen is soft. There is no mass.     Tenderness: There is no abdominal tenderness.  Lymphadenopathy:     Cervical: No cervical adenopathy.  Skin:    General: Skin is warm and dry.  Neurological:     General: No focal deficit present.     Mental Status: She is alert.  Psychiatric:        Mood and Affect: Mood normal.        Behavior: Behavior normal.    Assessment/Plan: Please see individual problem list.  Epigastric pain Assessment & Plan: Hx of hiatal  hernia. Taking Omeprazole 40 mg daily. Will refer to GI for possible Endoscopy and further evaluation.   Orders: -     Ambulatory referral to Gastroenterology  Rectal bleeding Assessment & Plan: Intermittent for the last year. Has never had evaluated, never had a Colonoscopy. Will refer to GI for further evaluation and Colonoscopy. Return precautions given to patient.   Orders: -     Ambulatory referral to Gastroenterology  Possible exposure to STD Assessment & Plan: Urine GC/Chlamydia and Trich pending. Denies any symptoms. No known exposure. Will contact patient with results.   Orders: -     Urine cytology ancillary only   Return if symptoms worsen or fail to improve.   Bethanie DickerKACY , NP-C [facility] [place]

## 2022-05-08 ENCOUNTER — Encounter: Payer: Self-pay | Admitting: Nurse Practitioner

## 2022-05-08 ENCOUNTER — Ambulatory Visit (INDEPENDENT_AMBULATORY_CARE_PROVIDER_SITE_OTHER): Payer: No Typology Code available for payment source | Admitting: Nurse Practitioner

## 2022-05-08 ENCOUNTER — Other Ambulatory Visit (HOSPITAL_COMMUNITY)
Admission: RE | Admit: 2022-05-08 | Discharge: 2022-05-08 | Disposition: A | Discharge status: Home / Self Care | Payer: No Typology Code available for payment source | Source: Ambulatory Visit | Attending: Nurse Practitioner | Admitting: Nurse Practitioner

## 2022-05-08 VITALS — BP 100/66 | HR 90 | Temp 98.3°F | Ht 61.0 in | Wt 118.2 lb

## 2022-05-08 DIAGNOSIS — K625 Hemorrhage of anus and rectum: Secondary | ICD-10-CM

## 2022-05-08 DIAGNOSIS — R1013 Epigastric pain: Secondary | ICD-10-CM | POA: Insufficient documentation

## 2022-05-08 DIAGNOSIS — Z202 Contact with and (suspected) exposure to infections with a predominantly sexual mode of transmission: Secondary | ICD-10-CM | POA: Diagnosis present

## 2022-05-08 NOTE — Assessment & Plan Note (Signed)
Urine GC/Chlamydia and Trich pending. Denies any symptoms. No known exposure. Will contact patient with results.

## 2022-05-08 NOTE — Assessment & Plan Note (Signed)
Intermittent for the last year. Has never had evaluated, never had a Colonoscopy. Will refer to GI for further evaluation and Colonoscopy. Return precautions given to patient.

## 2022-05-08 NOTE — Assessment & Plan Note (Signed)
Hx of hiatal hernia. Taking Omeprazole 40 mg daily. Will refer to GI for possible Endoscopy and further evaluation.

## 2022-05-09 LAB — URINE CYTOLOGY ANCILLARY ONLY
Chlamydia: NEGATIVE
Comment: NEGATIVE
Comment: NEGATIVE
Comment: NORMAL
Neisseria Gonorrhea: NEGATIVE
Trichomonas: NEGATIVE

## 2022-06-13 LAB — LAB REPORT - SCANNED: A1c: 5.3

## 2022-07-10 ENCOUNTER — Encounter: Payer: Self-pay | Admitting: Psychiatry

## 2022-08-10 DIAGNOSIS — F4310 Post-traumatic stress disorder, unspecified: Secondary | ICD-10-CM | POA: Diagnosis not present

## 2022-08-15 DIAGNOSIS — N766 Ulceration of vulva: Secondary | ICD-10-CM | POA: Diagnosis not present

## 2022-08-17 DIAGNOSIS — F4310 Post-traumatic stress disorder, unspecified: Secondary | ICD-10-CM | POA: Diagnosis not present

## 2022-08-21 ENCOUNTER — Encounter: Payer: Self-pay | Admitting: Nurse Practitioner

## 2022-08-21 ENCOUNTER — Ambulatory Visit (INDEPENDENT_AMBULATORY_CARE_PROVIDER_SITE_OTHER): Payer: BC Managed Care – PPO | Admitting: Nurse Practitioner

## 2022-08-21 VITALS — BP 120/74 | HR 83 | Temp 98.3°F | Ht 61.0 in | Wt 133.2 lb

## 2022-08-21 DIAGNOSIS — R1032 Left lower quadrant pain: Secondary | ICD-10-CM

## 2022-08-21 DIAGNOSIS — R195 Other fecal abnormalities: Secondary | ICD-10-CM | POA: Diagnosis not present

## 2022-08-21 LAB — HEMOCCULT GUIAC POC 1CARD (OFFICE): Fecal Occult Blood, POC: POSITIVE — AB

## 2022-08-21 MED ORDER — ONDANSETRON HCL 4 MG PO TABS: 4.0000 mg | ORAL_TABLET | Freq: Three times a day (TID) | ORAL | 0 refills | Status: DC | PRN

## 2022-08-21 NOTE — Progress Notes (Signed)
Established Patient Office Visit  Subjective:  Patient ID: Misty Lowe, female    DOB: 1975-11-09  Age: 47 y.o. MRN: [id number]  CC:  Chief Complaint  Patient presents with   Acute Visit    Diarrhea , vomiting and headache    HPI  Misty Lowe presents for diarrhea, vomiting and headache from last 14 days.  She also complaint of left-sided stomach pain, dizzy spell feels like inside of the stomach is on fire. She had 2 negative COVID home test on 7/17 and 7/21.  She had fever 100.2 on Sunday evening.  She took Imitrol, Tums, Pepto and antihistamine due to sinus drainage. She also complains of dark stool.  She states that she is eating bland diet and has normal stool yesterday.  She has an appointment with the GI on 10/16/22.  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      Family History  Problem Relation Age of Onset   Other Neg Hx     Social History   Socioeconomic History   Marital status: Married    Spouse name: Not on file   Number of children: Not on file   Years of education: Not on file   Highest education level: Not on file  Occupational History   Not on file  Tobacco Use   Smoking status: Never   Smokeless tobacco: Not on file  Substance and Sexual Activity   Alcohol use: No   Drug use: No   Sexual activity: Yes  Other Topics Concern   Not on file  Social History Narrative   Not on file   Social Determinants of Health   Financial Resource Strain: Not on file  Food Insecurity: Not on file  Transportation Needs: Not on file   Physical Activity: Not on file  Stress: Not on file  Social Connections: Not on file  Intimate Partner Violence: Not on file     Outpatient Medications Prior to Visit  Medication Sig Dispense Refill   albuterol (VENTOLIN HFA) 108 (90 Base) MCG/ACT inhaler Inhale 1-2 puffs into the lungs every 6 (six) hours as needed for wheezing.     BLISOVI FE 1/20 1-20 MG-MCG tablet Take 1 tablet by mouth daily.     brompheniramine-pseudoephedrine-DM 30-2-10 MG/5ML syrup Take 5-10 mLs by mouth every 6 (six) hours as needed. 120 mL 0   omeprazole (PRILOSEC) 40 MG capsule Take 1 capsule (40 mg total) by mouth daily. 90 capsule 3   QUEtiapine (SEROQUEL) 25 MG tablet Take 25 mg by mouth at bedtime.     venlafaxine XR (EFFEXOR-XR) 150 MG 24 hr capsule Take 150 mg by mouth every morning.     No facility-administered medications prior to visit.    Allergies  Allergen Reactions   Amoxicillin Anaphylaxis, Rash and Swelling    Other Reaction(s): Not available    ROS Review of Systems Negative unless indicated in HPI.    Objective:    Physical Exam Constitutional:      Appearance: Normal appearance.  HENT:  Mouth/Throat:     Mouth: Mucous membranes are moist.  Eyes:     Conjunctiva/sclera: Conjunctivae normal.     Pupils: Pupils are equal, round, and reactive to light.  Cardiovascular:     Rate and Rhythm: Normal rate and regular rhythm.     Pulses: Normal pulses.     Heart sounds: Normal heart sounds.  Pulmonary:     Effort: Pulmonary effort is normal.     Breath sounds: Normal breath sounds.  Abdominal:     General: Bowel sounds are normal.     Palpations: Abdomen is soft.     Tenderness: There is abdominal tenderness (Left quadrant).  Musculoskeletal:     Cervical back: Normal range of motion. No tenderness.  Skin:    General: Skin is warm.     Findings: No bruising.  Neurological:     General: No focal deficit present.     Mental Status: She is alert and oriented to person,  place, and time. Mental status is at baseline.  Psychiatric:        Mood and Affect: Mood normal.        Behavior: Behavior normal.        Thought Content: Thought content normal.        Judgment: Judgment normal.     BP 120/74   Pulse 83   Temp 98.3 F (36.8 C)   Ht 5\' 1"  (1.549 m)   Wt 133 lb 3.2 oz (60.4 kg)   SpO2 98%   BMI 25.17 kg/m  Wt Readings from Last 3 Encounters:  08/21/22 133 lb 3.2 oz (60.4 kg)  05/08/22 118 lb 3.2 oz (53.6 kg)  03/27/22 115 lb 6.4 oz (52.3 kg)     Health Maintenance  Topic Date Due   PAP SMEAR-Modifier  09/28/2018   DTaP/Tdap/Td (2 - Td or Tdap) 08/29/2021   COVID-19 Vaccine (1 - 2023-24 season) Never done   Hepatitis C Screening  03/28/2023 (Originally 10[birth year]28/1995)   INFLUENZA VACCINE  08/30/2022   HIV Screening  Completed   HPV VACCINES  Aged Out    There are no preventive care reminders to display for this patient.  Lab Results  Component Value Date   TSH 2.18 02/02/2022   Lab Results  Component Value Date   WBC 5.4 08/23/2022   HGB 12.5 08/23/2022   HCT 38.1 08/23/2022   MCV 93.1 08/23/2022   PLT 303.0 08/23/2022   Lab Results  Component Value Date   NA 139 08/23/2022   K 3.7 08/23/2022   CO2 26 08/23/2022   GLUCOSE 110 (H) 08/23/2022   BUN 14 08/23/2022   CREATININE 0.82 08/23/2022   BILITOT 0.4 02/02/2022   ALKPHOS 46 02/02/2022   AST 15 02/02/2022   ALT 12 02/02/2022   PROT 6.8 02/02/2022   ALBUMIN 4.3 02/02/2022   CALCIUM 8.8 08/23/2022   GFR 86.75 08/23/2022   Lab Results  Component Value Date   CHOL 215 (H) 02/02/2022   Lab Results  Component Value Date   HDL 56.40 02/02/2022   Lab Results  Component Value Date   LDLCALC 124 (H) 02/02/2022   Lab Results  Component Value Date   TRIG 174.0 (H) 02/02/2022   Lab Results  Component Value Date   CHOLHDL 4 02/02/2022   Lab Results  Component Value Date   HGBA1C 5.4 02/02/2022      Assessment & Plan:  Left lower quadrant abdominal  pain Assessment & Plan: Advised patient to consume BRAT and  keep a food diary for symptoms. Zofran sent for nausea. Will check metabolic panel.  Orders: -     Basic metabolic panel; Future -     CBC; Future  Dark stools Assessment & Plan: POCT occult blood stool positive. She have GI appointment in September.   Orders: -     POCT occult blood stool -     Fecal occult blood, imunochemical; Future  Other orders -     Ondansetron HCl; Take 1 tablet (4 mg total) by mouth every 8 (eight) hours as needed for nausea or vomiting.  Dispense: 20 tablet; Refill: 0    Follow-up: No follow-ups on file.   Kara Dies, NP

## 2022-08-23 ENCOUNTER — Other Ambulatory Visit (INDEPENDENT_AMBULATORY_CARE_PROVIDER_SITE_OTHER): Payer: BC Managed Care – PPO

## 2022-08-23 ENCOUNTER — Other Ambulatory Visit: Payer: BC Managed Care – PPO

## 2022-08-23 DIAGNOSIS — R1032 Left lower quadrant pain: Secondary | ICD-10-CM | POA: Diagnosis not present

## 2022-08-24 DIAGNOSIS — F4310 Post-traumatic stress disorder, unspecified: Secondary | ICD-10-CM | POA: Diagnosis not present

## 2022-08-24 LAB — BASIC METABOLIC PANEL: Creatinine, Ser: 0.82 mg/dL (ref 0.40–1.20)

## 2022-08-24 LAB — CBC: Platelets: 303 10*3/uL (ref 150.0–400.0)

## 2022-08-28 DIAGNOSIS — F411 Generalized anxiety disorder: Secondary | ICD-10-CM | POA: Diagnosis not present

## 2022-08-28 DIAGNOSIS — R195 Other fecal abnormalities: Secondary | ICD-10-CM | POA: Insufficient documentation

## 2022-08-28 DIAGNOSIS — F321 Major depressive disorder, single episode, moderate: Secondary | ICD-10-CM | POA: Diagnosis not present

## 2022-08-28 DIAGNOSIS — R1032 Left lower quadrant pain: Secondary | ICD-10-CM | POA: Insufficient documentation

## 2022-08-28 DIAGNOSIS — G4701 Insomnia due to medical condition: Secondary | ICD-10-CM | POA: Diagnosis not present

## 2022-08-28 DIAGNOSIS — F4311 Post-traumatic stress disorder, acute: Secondary | ICD-10-CM | POA: Diagnosis not present

## 2022-08-28 NOTE — Assessment & Plan Note (Signed)
POCT occult blood stool positive. She have GI appointment in September.

## 2022-08-28 NOTE — Assessment & Plan Note (Signed)
Advised patient to consume BRAT and keep a food diary for symptoms. Zofran sent for nausea. Will check metabolic panel.

## 2022-08-31 DIAGNOSIS — F4310 Post-traumatic stress disorder, unspecified: Secondary | ICD-10-CM | POA: Diagnosis not present

## 2022-09-07 DIAGNOSIS — F4310 Post-traumatic stress disorder, unspecified: Secondary | ICD-10-CM | POA: Diagnosis not present

## 2022-09-11 DIAGNOSIS — C4492 Squamous cell carcinoma of skin, unspecified: Secondary | ICD-10-CM | POA: Diagnosis not present

## 2022-09-14 DIAGNOSIS — F4310 Post-traumatic stress disorder, unspecified: Secondary | ICD-10-CM | POA: Diagnosis not present

## 2022-09-20 ENCOUNTER — Ambulatory Visit (INDEPENDENT_AMBULATORY_CARE_PROVIDER_SITE_OTHER): Payer: BC Managed Care – PPO

## 2022-09-20 DIAGNOSIS — R195 Other fecal abnormalities: Secondary | ICD-10-CM | POA: Diagnosis not present

## 2022-09-20 LAB — FECAL OCCULT BLOOD, IMMUNOCHEMICAL: Fecal Occult Bld: NEGATIVE

## 2022-09-21 ENCOUNTER — Telehealth: Payer: Self-pay

## 2022-09-21 DIAGNOSIS — F4310 Post-traumatic stress disorder, unspecified: Secondary | ICD-10-CM | POA: Diagnosis not present

## 2022-09-21 NOTE — Telephone Encounter (Signed)
Spoke with the patient regarding the referral to GYN oncology. Patient scheduled as new patient with Dr Alvester Morin on 09/24/2022. Patient given an arrival time of 8:30am.  Explained to the patient the the doctor will perform a pelvic exam at this visit. Patient given the policy that only one visitor allowed and that visitor must be over 16 yrs are allowed in the Cancer Center. Patient given the address/phone number for the clinic and that the center offers free valet service. Patient aware that masks are option.

## 2022-09-24 ENCOUNTER — Inpatient Hospital Stay: Payer: BC Managed Care – PPO | Attending: Psychiatry | Admitting: Psychiatry

## 2022-09-24 ENCOUNTER — Telehealth: Payer: Self-pay | Admitting: Oncology

## 2022-09-24 ENCOUNTER — Encounter: Payer: Self-pay | Admitting: Psychiatry

## 2022-09-24 VITALS — BP 122/78 | HR 88 | Temp 98.5°F | Resp 17 | Wt 136.6 lb

## 2022-09-24 DIAGNOSIS — R300 Dysuria: Secondary | ICD-10-CM | POA: Insufficient documentation

## 2022-09-24 DIAGNOSIS — C519 Malignant neoplasm of vulva, unspecified: Secondary | ICD-10-CM | POA: Diagnosis not present

## 2022-09-24 DIAGNOSIS — N809 Endometriosis, unspecified: Secondary | ICD-10-CM | POA: Diagnosis not present

## 2022-09-24 DIAGNOSIS — L292 Pruritus vulvae: Secondary | ICD-10-CM | POA: Insufficient documentation

## 2022-09-24 DIAGNOSIS — H9190 Unspecified hearing loss, unspecified ear: Secondary | ICD-10-CM | POA: Insufficient documentation

## 2022-09-24 DIAGNOSIS — N941 Unspecified dyspareunia: Secondary | ICD-10-CM | POA: Insufficient documentation

## 2022-09-24 DIAGNOSIS — K449 Diaphragmatic hernia without obstruction or gangrene: Secondary | ICD-10-CM | POA: Diagnosis not present

## 2022-09-24 DIAGNOSIS — N900 Mild vulvar dysplasia: Secondary | ICD-10-CM | POA: Diagnosis not present

## 2022-09-24 DIAGNOSIS — Z79899 Other long term (current) drug therapy: Secondary | ICD-10-CM | POA: Diagnosis not present

## 2022-09-24 NOTE — Patient Instructions (Addendum)
Today Dr. Alvester Morin took two vulvar biopsies. You may experience light spotting or a grayish discharge from the medication used to stop bleeding. Use the peri bottle when toileting and pat dry. You can continue use of the topical lidocaine in these areas. You can also use frozen peas in a ziploc bag as an ice pack for discomfort as well. You will be contacted with the results when available.  Plan on having a PET scan to evaluate for signs of cancer spread. For the PET scan, nothing to eat or drink 6 hours before the scan.   We will move forward with arranging surgery at [facility] with Dr. Alvester Morin. You will receive phone calls with additional information pertaining to this.   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

## 2022-09-24 NOTE — Telephone Encounter (Signed)
Called [facility] and left a message for Misty Lowe requesting a change of the collection date and depth of invasion on accession [id number].

## 2022-09-24 NOTE — Telephone Encounter (Signed)
Misty Lowe from [facility] called back and will change the collection date on the second opinion report.  They would need to request the slides again to comment on depth of invasion.  Let her know that we do not need this per Dr. Alvester Morin since biopsies were taken today at patient's appointment.

## 2022-09-24 NOTE — Progress Notes (Signed)
GYNECOLOGIC ONCOLOGY NEW PATIENT CONSULTATION  Date of Service: 09/24/2022 Referring Provider: [name], MD   ASSESSMENT AND PLAN: Misty Lowe is a 47 y.o. 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  We reviewed the nature of vulvar cancer and the recommend treatment of surgical staging.  Surgery would include a partial radical vulvectomy with bilateral inguinal lymph node assessment.  Following surgery, some patients may require adjuvant treatment with radiation depending on final pathology.  No palpable inguinofemoral lymphadenopathy on exam.  Recommend preoperative PET CT to rule out distant disease. Will notify pt of results when available.  Will plan for surgery at [facility] to aid in coordination of care for sentinel lymph node assessment. Will plan NM lymphoscintigram as well as ICG dye.   Reviewed that given the close proximity of the lesion to her clitoris, in order to obtain an adequate margin, I believe we will likely have to remove her clitoris.   Patient was consented for: pelvic exam under anesthesia, partial radical vulvectomy (with removal of clitoris), bilateral inguinofemoral sentinel lymph node evaluation and biopsy, possible lymphadenectomy on TBD.  The risks of surgery were discussed in detail and she understands these to including but not limited to bleeding requiring a blood transfusion, infection, injury to adjacent organs (including but not limited to the bowels, bladder, ureters, nerves, blood vessels), thromboembolic events, wound separation, possible risk of lymphedema and lymphocyst if lymphadenectomy performed, unforseen complication, and possible need for re-exploration.  If the patient experiences any of these events, she understands that her hospitalization or recovery may be prolonged and that she may need to take additional medications for a prolonged period. The patient will receive DVT  and antibiotic prophylaxis as indicated. She voiced a clear understanding. She had the opportunity to ask questions and informed consent was obtained today. She wishes to proceed.  She does not require preoperative clearance. Her METs are >4.  Preoperative instructions were reviewed and additional instructions will be sent via MyChart. Will work to arrange surgery at [facility]   A copy of this note was sent to the patient's referring provider.  [name], MD Gynecologic Oncology   Medical Decision Making I personally spent  TOTAL 70 minutes face-to-face and non-face-to-face in the care of this patient, which includes all pre, intra, and post visit time on the date of service.  ------------  CC: vulvar cancer  HISTORY OF PRESENT ILLNESS:  Misty Lowe is a 47 y.o. woman who is seen in consultation at the request of [name], MD for evaluation of vulvar cancer.  Patient was seen by her OB/GYN on 08/15/2022 with concerns of poor healing from a biopsy site on the left labia that was open and draining.  On exam she was noted to have irregular hyperpigmentation of the vulva consistent with postinflammatory hyperpigmentation.  On the left labia minora at 2:00 she had a 1 cm ulcerated like lesion.  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  An area of the left vulva had been biopsied twice previously in 2016 and 2023.  Past biopsies were noted to be nondiagnostic, so decision was made to treat for possible HSV infection and check HSV culture.  Also discussed possible wide incision for both diagnostic and therapeutic purposes.  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  Today, she presents with her boyfriend. She endorses the history as  above. She denies any biopsies since the one last August. She reports having a lesion of her vulva for several years, but  it has doubled in size in the past year. She reports that the pain from the vulva has been getting worse over time as well as the itching. She has used a steroid cream which helped some originally, but more recently in the past few months was making it feel worse. She has started using some topical lidocaine which has helped some with the pain. She otherwise notes a 25lb weight loss in the past year that she attributes to the stress of a difficult separation, and has gained back 20lbs of that loss. She denies change in bladder habits. She notes bloating and early satiety in the past year which she attributes in part to her hiatal hernia. Sh has also had diarrhea which has improved with removing dairy from her diet. She is going to be seeing GI for some of these symptoms. She was last tested and negative for HIV on 08/15/22.  Pt endorses a history of endometriosis for which she is on OCPs and takes them continuous with a withdrawal bleed q 3mo.     PAST MEDICAL HISTORY: Past Medical History:  Diagnosis Date   Endometriosis    Hearing impairment    Hiatal hernia    PONV (postoperative nausea and vomiting)     PAST SURGICAL HISTORY: Past Surgical History:  Procedure Laterality Date   CESAREAN SECTION     CESAREAN SECTION  08/28/2011   Procedure: CESAREAN SECTION;  Surgeon: Philip Aspen, DO;  Location: [facility];  Service: Gynecology;  Laterality: N/A;   DIAGNOSTIC LAPAROSCOPY  2007   for pain, per pt diagnosed with endometriosis   DILATION AND CURETTAGE OF UTERUS     for SAB   EAR MASTOIDECTOMY W/ COCHLEAR IMPLANT W/ LANDMARK      OB/GYN HISTORY: OB History  Gravida Para Term Preterm AB Living  3 2   2 1 2   SAB IAB Ectopic Multiple Live Births  1       2    # Outcome Date GA Lbr Len/2nd Weight Sex Type Anes PTL Lv  3 Preterm 08/28/11 [redacted]w[redacted]d   M CS-LTranv Spinal  LIV  2 SAB           1 Preterm      CS-Unspec   LIV      Age at menarche: 69 Age at menopause: N/A Hx of HRT: ocps,  with q19mo withdrawal Hx of STI: No Last pap: 07/26/2020, within normal limits History of abnormal pap smears: No  SCREENING STUDIES:  Last mammogram: 07/2021 Last colonoscopy: N/A  MEDICATIONS:  Current Outpatient Medications:    albuterol (VENTOLIN HFA) 108 (90 Base) MCG/ACT inhaler, Inhale 1-2 puffs into the lungs every 6 (six) hours as needed for wheezing., Disp: , Rfl:    BLISOVI FE 1/20 1-20 MG-MCG tablet, Take 1 tablet by mouth daily., Disp: , Rfl:    brompheniramine-pseudoephedrine-DM 30-2-10 MG/5ML syrup, Take 5-10 mLs by mouth every 6 (six) hours as needed., Disp: 120 mL, Rfl: 0   HYDROcodone-acetaminophen (NORCO) 5-325 MG tablet, Take 1 tablet by mouth every 6 (six) hours as needed for up to 3 days for moderate pain., Disp: 8 tablet, Rfl: 0   omeprazole (PRILOSEC) 40 MG capsule, Take 1 capsule (40 mg total) by mouth daily., Disp: 90 capsule, Rfl: 3   ondansetron (ZOFRAN) 4 MG tablet, Take 1 tablet (4 mg total) by mouth every 8 (  eight) hours as needed for nausea or vomiting., Disp: 20 tablet, Rfl: 0   QUEtiapine (SEROQUEL) 25 MG tablet, Take 25 mg by mouth at bedtime., Disp: , Rfl:    venlafaxine XR (EFFEXOR-XR) 150 MG 24 hr capsule, Take 150 mg by mouth every morning., Disp: , Rfl:   ALLERGIES: Allergies  Allergen Reactions   Amoxicillin Anaphylaxis, Rash and Swelling    Other Reaction(s): Not available    FAMILY HISTORY: Family History  Problem Relation Age of Onset   Other Neg Hx     SOCIAL HISTORY: Social History   Socioeconomic History   Marital status: Legally Separated    Spouse name: Not on file   Number of children: Not on file   Years of education: Not on file   Highest education level: Not on file  Occupational History   Not on file  Tobacco Use   Smoking status: Never   Smokeless tobacco: Not on file  Substance and Sexual Activity   Alcohol use: No   Drug use: No   Sexual activity: Yes  Other Topics Concern   Not on file  Social History  Narrative   Not on file   Social Determinants of Health   Financial Resource Strain: Not on file  Food Insecurity: Not on file  Transportation Needs: Not on file  Physical Activity: Not on file  Stress: Not on file  Social Connections: Not on file  Intimate Partner Violence: Not on file    REVIEW OF SYSTEMS: New patient intake form was reviewed.  Complete 10-system review is negative except for the following: Pain with intercourse, pain with urination, itching  PHYSICAL EXAM: BP 122/78 (BP Location: Left Arm, Patient Position: Sitting)   Pulse 88   Temp 98.5 F (36.9 C) (Oral)   Resp 17   Wt 136 lb 9.6 oz (62 kg)   SpO2 100%   BMI 25.81 kg/m  Constitutional: No acute distress. Neuro/Psych: Alert, oriented.  Head and Neck: Normocephalic, atraumatic. Neck symmetric without masses. Sclera anicteric.  Respiratory: Normal work of breathing. Clear to auscultation bilaterally. Cardiovascular: Regular rate and rhythm, no murmurs, rubs, or gallops. Abdomen: Normoactive bowel sounds. Soft, non-distended, non-tender to palpation. No masses appreciated. No evidence of hernia. No palpable fluid wave. Well healed pfannenstiel, laparoscopic incisions. Extremities: Grossly normal range of motion. Warm, well perfused. No edema bilaterally. Skin: No rashes or lesions. Lymphatic: No cervical, supraclavicular, or inguinal adenopathy. Genitourinary: External genitalia with ulcerated lesion of the left labia minora which is within 2cm of the midline. One satellite 2mm ulcerated area in the lateral crease of the left labia minora which is more proximal to the clitoris. Primary lesion within 1.5cm of the clitoris. Satellite lesion within 5mm of the clitoris. Hyperpigmentation of the introitus. Urethral meatus without lesions or prolapse. On speculum exam, vagina and cervix without lesions. Bimanual exam reveals normal cervix and uterus, no adnexal masses.  Exam chaperoned by Warner Mccreedy, NP  VULVAR  COLPOSCOPY PROCEDURE NOTE  Procedure Details: After appropriate verbal informed consent was obtained, a timeout was performed. Acetic acid was applied to the vulva and the vulva was inspected with the colposcope with the findings as noted below. The skin was cleaned with Betadine.  1 ml of 1% lidocaine was injected at each of the planned biopsy sites.  A 3mm mm punch biopsy was used to obtain the biopsy specimens.  Hemostasis was achieved with silver nitrate.  The vulva was then cleansed of the acetic acid with water. The patient  tolerated the procedure well.   Adequate Exam: Yes  Biopsy Specimen: left labia minora at ulcerated lesion, lateral right labia minora  Condition: Stable. Patient tolerated procedure well.  Complications: None  Findings: Ulcerated lesion of the left labia minora which is within 2cm of the midline. One satellite 2mm ulcerated area in the lateral crease of the left labia minora which is more proximal to the clitoris. Acetowhite changes additionally on the lateral surface of the right labia minora  Colposcopic Impression: Cancer and possible dysplasia   LABORATORY AND RADIOLOGIC DATA: Outside medical records were reviewed to synthesize the above history, along with the history and physical obtained during the visit.  Outside laboratory, pathology reports were reviewed, with pertinent results below.  WBC  Date Value Ref Range Status  08/23/2022 5.4 4.0 - 10.5 K/uL Final   Hemoglobin  Date Value Ref Range Status  08/23/2022 12.5 12.0 - 15.0 g/dL Final   HCT  Date Value Ref Range Status  08/23/2022 38.1 36.0 - 46.0 % Final   Platelets  Date Value Ref Range Status  08/23/2022 303.0 150.0 - 400.0 K/uL Final   Creatinine, Ser  Date Value Ref Range Status  08/23/2022 0.82 0.40 - 1.20 mg/dL Final   AST  Date Value Ref Range Status  02/02/2022 15 0 - 37 U/L Final   ALT  Date Value Ref Range Status  02/02/2022 12 0 - 35 U/L Final    Surgical pathology  (09/07/21) (consult reported 09/19/22): Left labia vulvar biopsy: Moderately to poorly differentiated squamous cell carcinoma, a acantholytic (adenoid) variant.

## 2022-09-25 NOTE — Progress Notes (Deleted)
Celso Amy, PA-C [address]  [address]  [place], [place] [address]  Main: [phone]  Fax: [phone]   Gastroenterology Consultation  Referring Provider:     Bethanie Dicker, NP Primary Care Physician:  Bethanie Dicker, NP Primary Gastroenterologist:  *** Reason for Consultation:     Rectal bleeding, GERD        HPI:   Misty Lowe is a 47 y.o. y/o female referred for consultation & management  by Bethanie Dicker, NP.  ***  Past Medical History:  Diagnosis Date   Endometriosis    Hearing impairment    Hiatal hernia    PONV (postoperative nausea and vomiting)     Past Surgical History:  Procedure Laterality Date   CESAREAN SECTION     CESAREAN SECTION  08/28/2011   Procedure: CESAREAN SECTION;  Surgeon: Philip Aspen, DO;  Location: [facility];  Service: Gynecology;  Laterality: N/A;   DIAGNOSTIC LAPAROSCOPY  2007   for pain, per pt diagnosed with endometriosis   DILATION AND CURETTAGE OF UTERUS     for SAB   EAR MASTOIDECTOMY W/ COCHLEAR IMPLANT W/ LANDMARK      Prior to Admission medications   Medication Sig Start Date End Date Taking? Authorizing Provider  albuterol (VENTOLIN HFA) 108 (90 Base) MCG/ACT inhaler Inhale 1-2 puffs into the lungs every 6 (six) hours as needed for wheezing.    [provider]  BLISOVI FE 1/20 1-20 MG-MCG tablet Take 1 tablet by mouth daily. 01/10/22   [provider]  brompheniramine-pseudoephedrine-DM 30-2-10 MG/5ML syrup Take 5-10 mLs by mouth every 6 (six) hours as needed. 03/27/22   Bethanie Dicker, NP  omeprazole (PRILOSEC) 40 MG capsule Take 1 capsule (40 mg total) by mouth daily. 02/02/22   Bethanie Dicker, NP  ondansetron (ZOFRAN) 4 MG tablet Take 1 tablet (4 mg total) by mouth every 8 (eight) hours as needed for nausea or vomiting. 08/21/22   Kara Dies, NP  QUEtiapine (SEROQUEL) 25 MG tablet Take 25 mg by mouth at bedtime. 05/04/22   [provider]  venlafaxine XR (EFFEXOR-XR) 150 MG 24 hr  capsule Take 150 mg by mouth every morning. 04/19/22   [provider]    Family History  Problem Relation Age of Onset   Other Neg Hx      Social History   Tobacco Use   Smoking status: Never  Substance Use Topics   Alcohol use: No   Drug use: No    Allergies as of 09/26/2022 - Review Complete 08/21/2022  Allergen Reaction Noted   Amoxicillin Anaphylaxis, Rash, and Swelling 08/27/2011    Review of Systems:    All systems reviewed and negative except where noted in HPI.   Physical Exam:  There were no vitals taken for this visit. No LMP recorded.  General:   Alert,  Well-developed, well-nourished, pleasant and cooperative in NAD Lungs:  Respirations even and unlabored.  Clear throughout to auscultation.   No wheezes, crackles, or rhonchi. No acute distress. Heart:  Regular rate and rhythm; no murmurs, clicks, rubs, or gallops. Abdomen:  Normal bowel sounds.  No bruits.  Soft, and non-distended without masses, hepatosplenomegaly or hernias noted.  No Tenderness.  No guarding or rebound tenderness.    Neurologic:  Alert and oriented x3;  grossly normal neurologically. Psych:  Alert and cooperative. Normal mood and affect.  Imaging Studies: No results found.  Assessment and Plan:   Misty Lowe is a 47 y.o. y/o female has  been referred for ***  Follow up ***  Celso Amy, PA-C    BP check ***

## 2022-09-26 ENCOUNTER — Ambulatory Visit: Payer: No Typology Code available for payment source | Admitting: Physician Assistant

## 2022-09-26 ENCOUNTER — Other Ambulatory Visit: Payer: Self-pay

## 2022-09-26 ENCOUNTER — Emergency Department
Admission: EM | Admit: 2022-09-26 | Discharge: 2022-09-26 | Disposition: A | Discharge status: Home / Self Care | Payer: BC Managed Care – PPO | Attending: Emergency Medicine | Admitting: Emergency Medicine

## 2022-09-26 ENCOUNTER — Telehealth: Payer: Self-pay | Admitting: *Deleted

## 2022-09-26 DIAGNOSIS — N99820 Postprocedural hemorrhage and hematoma of a genitourinary system organ or structure following a genitourinary system procedure: Secondary | ICD-10-CM | POA: Diagnosis not present

## 2022-09-26 DIAGNOSIS — G8918 Other acute postprocedural pain: Secondary | ICD-10-CM | POA: Diagnosis not present

## 2022-09-26 DIAGNOSIS — N9089 Other specified noninflammatory disorders of vulva and perineum: Secondary | ICD-10-CM | POA: Insufficient documentation

## 2022-09-26 DIAGNOSIS — N939 Abnormal uterine and vaginal bleeding, unspecified: Secondary | ICD-10-CM | POA: Diagnosis not present

## 2022-09-26 DIAGNOSIS — R110 Nausea: Secondary | ICD-10-CM | POA: Diagnosis not present

## 2022-09-26 LAB — SURGICAL PATHOLOGY

## 2022-09-26 MED ORDER — HYDROCODONE-ACETAMINOPHEN 5-325 MG PO TABS: 1.0000 | ORAL_TABLET | Freq: Four times a day (QID) | ORAL | 0 refills | Status: AC | PRN

## 2022-09-26 MED ORDER — FENTANYL CITRATE PF 50 MCG/ML IJ SOSY
50.0000 ug | PREFILLED_SYRINGE | Freq: Once | INTRAMUSCULAR | Status: AC
  Administered 2022-09-26: 50 ug via INTRAVENOUS
  Filled 2022-09-26: qty 1

## 2022-09-26 MED ORDER — HYDROCODONE-ACETAMINOPHEN 5-325 MG PO TABS
1.0000 | ORAL_TABLET | Freq: Once | ORAL | Status: AC
  Administered 2022-09-26: 1 via ORAL
  Filled 2022-09-26: qty 1

## 2022-09-26 NOTE — Discharge Instructions (Signed)
You were seen in the ER today for evaluation of bleeding from your recent biopsy site.  Fortunately the bleeding is controlled here.  I sent a short course of pain medicine to your pharmacy that you can take as needed for your pain.  Do not drive or operate machinery when taking this.  Please contact your gynecology office to arrange follow-up.  Return to the ER for new or worsening symptoms.

## 2022-09-26 NOTE — Telephone Encounter (Signed)
Spoke with Ms. Pera who called the office from her gastroenterologist's office in [place]. Pt states once she arrived at the office in [place], she started having severe abdominal cramping with vaginal bleeding. Pt states she had been spotting since her vaginal biopsy on Monday, but the bleeding increased once she arrived at gastroenterology appt. Pt states the blood is running down her legs and she is unable to tell if it's coming from the biopsy site. Pt states she has irregular periods and the bleeding is much lighter when she has a cycle.  Pt denies urinary symptoms, but does state she has had a low-grade fever for the past two days, denies chills. Pt also states her parents both have covid, and she tested negative this morning.  Pt advised if she can have someone drive her to the office, Warner Mccreedy, NP would like to see her if not she should be evaluated in the ED.

## 2022-09-26 NOTE — ED Provider Notes (Signed)
[facility] Provider Note    Event Date/Time   First MD Initiated Contact with Patient 09/26/22 1456     (approximate)   History   Post-op Problem   HPI  DEBBE AFABLE is a 47 y.o. female for evaluation of vulvar bleeding.  2 days ago, patient had a biopsy performed over her right and left labia.  Her pain had been relatively well-controlled with topical lidocaine and Aleve.  Earlier today, patient had an appointment with her gastroenterologist and she had to walk around for a while to try and find the right location.  During this time, she began to have worsening pain at her biopsy site as well as development of bleeding in the area.  She is unsure if this is from her biopsy site or possible vaginal bleeding.  Does still get menstrual cycles, but usually is on a 24-month schedule with her medication and is not scheduled to have menstrual cycle for about another month.  Has had some ongoing issues with abdominal cramping for which she was seeing GI, but denies any acute worsening in her symptoms.  No fevers chills, chest pain, shortness of breath.      Physical Exam   Triage Vital Signs: ED Triage Vitals  Encounter Vitals Group     BP 09/26/22 1444 (!) 144/76     Systolic BP Percentile --      Diastolic BP Percentile --      Pulse Rate 09/26/22 1444 99     Resp 09/26/22 1444 18     Temp 09/26/22 1444 98.4 F (36.9 C)     Temp Source 09/26/22 1444 Oral     SpO2 09/26/22 1444 100 %     Weight 09/26/22 1450 136 lb (61.7 kg)     Height 09/26/22 1450 5\' 1"  (1.549 m)     Head Circumference --      Peak Flow --      Pain Score 09/26/22 1449 6     Pain Loc --      Pain Education --      Exclude from Growth Chart --     Most recent vital signs: Vitals:   09/26/22 1630 09/26/22 1700  BP: 118/76 120/85  Pulse:    Resp:    Temp:    SpO2:       General: Awake, interactive  CV:  Regular rate, good peripheral perfusion.  Resp:  Lungs clear,  unlabored respirations.  Abd:  Soft, nondistended, nontender to palpation diffusely GU:   External genitalia as below.  There is a biopsy site over the right with healing tissue, no active bleeding.  There is a biopsy site over the left vulva that is not actively bleeding, but does have some surrounding dried blood and there is blood noted on toilet paper that was in patient's underwear. Speculum exam with physiologic whitish vaginal discharge without evidence of recent bleeding.  Cervix visualized and closed in appearance. Neuro:  Symmetric facial movement, fluid speech     ED Results / Procedures / Treatments   Labs (all labs ordered are listed, but only abnormal results are displayed) Labs Reviewed - No data to display   EKG EKG independently reviewed interpreted by myself (ER attending) demonstrates:    RADIOLOGY Imaging independently reviewed and interpreted by myself demonstrates:    PROCEDURES:  Critical Care performed: No  Procedures   MEDICATIONS ORDERED IN ED: Medications  fentaNYL (SUBLIMAZE) injection 50 mcg (50 mcg Intravenous Given 09/26/22  1611)  HYDROcodone-acetaminophen (NORCO/VICODIN) 5-325 MG per tablet 1 tablet (1 tablet Oral Given 09/26/22 1710)     IMPRESSION / MDM / ASSESSMENT AND PLAN / ED COURSE  I reviewed the triage vital signs and the nursing notes.  Differential diagnosis includes, but is not limited to, bleeding from recent biopsy site, development of vaginal bleeding, infection of prior biopsy site  Patient's presentation is most consistent with acute complicated illness / injury requiring diagnostic workup.  47 year old female presenting to the emergency department for evaluation of bleeding in the setting of recent vulvar biopsy.  On exam, patient does appear to have recent bleeding coming from her vulvar biopsy site, no evidence of vaginal bleeding.  However, no significant active bleeding at the time of my evaluation.  Case was reviewed  with Warner Mccreedy, NP with [facility] health gynecology oncology.  She was familiar with the patient and actually recommended that the patient present to their office if she was able.  She reviewed to the patient image above.  She reported that if there was significant bleeding, could apply silver nitrate to the area.  However, she reported that if bleeding was now controlled, no need for further intervention and patient was okay for discharge with follow-up.  Patient does have significant discomfort in this area.  With her worsening pain and developing bleeding, will DC with a short course of pain medicine.  Strict return precautions provided.  Patient discharged in stable condition.     FINAL CLINICAL IMPRESSION(S) / ED DIAGNOSES   Final diagnoses:  Vulvar bleeding  Post-operative pain     Rx / DC Orders   ED Discharge Orders          Ordered    HYDROcodone-acetaminophen (NORCO) 5-325 MG tablet  Every 6 hours PRN        09/26/22 1719             Note:  This document was prepared using Dragon voice recognition software and may include unintentional dictation errors.   Trinna Post, MD 09/27/22 [phone]

## 2022-09-26 NOTE — ED Triage Notes (Signed)
Pt. To ED for vulva bleeding, pt. Had vulva biopsy 2 days pta, states she had increased bleeding and pain at site since 0930 today.

## 2022-09-27 ENCOUNTER — Telehealth: Payer: Self-pay

## 2022-09-27 ENCOUNTER — Telehealth: Payer: Self-pay | Admitting: *Deleted

## 2022-09-27 NOTE — Transitions of Care (Post Inpatient/ED Visit) (Signed)
Pt said she was seen at [facility] ED on 09/26/22 with disorder of vulva and perineum after recent biopsy of both rt and lt labia. and pt did have bleeding after being up and walking more. after seen at [facility] ED blood clots had come out and bleeding was controlled and stopped without cauterization. Pt is calling this afternoon for appt with GYN as ED FU. Pt said the hydrocodone apap 5-325 mg is helping the pain and pt is resting in recliner at this time. Pt said if needs to see Jacqualin Combes NP pt will contact [facility] for appt. Pt also wanted Jacqualin Combes NP to know that biopsy from last year finally confirmed CA and pt is waiting on results from biopsy recently done.Sending note to Bethanie Dicker NP.       09/27/2022  Name: Misty Lowe MRN: [id number] DOB: 08-16-1975  Today's TOC FU Call Status: Today's TOC FU Call Status:: Successful TOC FU Call Completed TOC FU Call Complete Date: 09/27/22 Patient's Name and Date of Birth confirmed.  Transition Care Management Follow-up Telephone Call Date of Discharge: 09/26/22 Discharge Facility: [facility]) Type of Discharge: Emergency Department Reason for ED Visit: Other: (disorder of vulva and perineum after recent biopsyof both rt and lt labia. and pt did have bleeding after being up and walking more. after seen at [facility] ED blood clots had come out and bleeding was controlled and stopped without cauterization.) How have you been since you were released from the hospital?: Better Any questions or concerns?: No  Items Reviewed: Did you receive and understand the discharge instructions provided?: Yes Medications obtained,verified, and reconciled?: Yes (Medications Reviewed) Any new allergies since your discharge?: No Dietary orders reviewed?: NA Do you have support at home?: Yes People in Home: sibling(s) Name of Support/Comfort Primary Source: Misty Lowe  Medications Reviewed Today: Medications Reviewed Today    Medications were not reviewed in this encounter     Home Care and Equipment/Supplies: Were Home Health Services Ordered?: NA Any new equipment or medical supplies ordered?: NA  Functional Questionnaire: Do you need assistance with bathing/showering or dressing?: No Do you need assistance with meal preparation?: No Do you need assistance with eating?: No Do you have difficulty maintaining continence: No Do you need assistance with getting out of bed/getting out of a chair/moving?: No Do you have difficulty managing or taking your medications?: No  Follow up appointments reviewed: PCP Follow-up appointment confirmed?: NA (if pt needs appt with Jacqualin Combes NP pt willl contact [facility].) Specialist Hospital Follow-up appointment confirmed?: No (pt is calling this afternoon to schedule FU with GYN.) Follow-Up Specialty Provider:: GYN Reason Specialist Follow-Up Not Confirmed: Patient has Specialist Provider Number and will Call for Appointment Do you need transportation to your follow-up appointment?: No Do you understand care options if your condition(s) worsen?: Yes-patient verbalized understanding    SIGNATURE Lewanda Rife, LPN

## 2022-09-27 NOTE — Telephone Encounter (Signed)
Received PC from patient, she states she had bx here on Monday, 09/24/22, with Dr Alvester Morin,  She began having heavy vaginal bleeding & extreme pain yesterday & went to ED.  She is calling today to see if she needs to schedule a F/U appointment with Dr Alvester Morin.  Per M.Cross NP, if patient is not bleeding heavily, she may wait until after her PET on 10/05/22 to come in.  Patient states she is having minimal bleeding today, she stayed home & is resting.  She states her pain is worse with movement & certain positions but is mostly controlled with the norco she was prescribed.  Patient asked if she can take tylenol along with the norco, informed patient each tablet has 325 mg of tylenol & she cannot take more than 4,000 mg of tylenol in 24 hours.  Informed patient an appointment will be set up for her after her PET scan.  Instructed patient to contact this office if her bleeding increases or her pain is not controlled with her pain medication.  She verbalizes understanding.

## 2022-09-28 ENCOUNTER — Encounter: Payer: Self-pay | Admitting: Psychiatry

## 2022-09-28 ENCOUNTER — Telehealth: Payer: Self-pay | Admitting: Nurse Practitioner

## 2022-09-28 DIAGNOSIS — F4310 Post-traumatic stress disorder, unspecified: Secondary | ICD-10-CM | POA: Diagnosis not present

## 2022-09-28 NOTE — Telephone Encounter (Signed)
Called pt and she stated that she took an at home UTI test and it was positive, I reiterated to pt that she would need to be seen before medications could be sent and she verbalized understanding and stated that she would just reach out to her OBGYN who used to send in meds for her

## 2022-09-28 NOTE — Telephone Encounter (Signed)
Pt called in stating that she would like to let [facility] NP knows that she's having bladder pain/UTI and was wondering if Arville Lime wants to sent some meds over to her pharmacy. I told her Arville Lime its not in office today and without appts they will not prescribed meds, urine have to test it. She denied appts, and just wants to let her nurse knows what's going on. Her call back number is [phone].

## 2022-10-05 ENCOUNTER — Ambulatory Visit (HOSPITAL_COMMUNITY)
Admission: RE | Admit: 2022-10-05 | Discharge: 2022-10-05 | Disposition: A | Discharge status: Home / Self Care | Payer: BC Managed Care – PPO | Source: Ambulatory Visit | Attending: Psychiatry | Admitting: Psychiatry

## 2022-10-05 DIAGNOSIS — C519 Malignant neoplasm of vulva, unspecified: Secondary | ICD-10-CM | POA: Diagnosis not present

## 2022-10-05 DIAGNOSIS — F4310 Post-traumatic stress disorder, unspecified: Secondary | ICD-10-CM | POA: Diagnosis not present

## 2022-10-05 LAB — GLUCOSE, CAPILLARY: Glucose-Capillary: 85 mg/dL (ref 70–99)

## 2022-10-05 MED ORDER — FLUDEOXYGLUCOSE F - 18 (FDG) INJECTION
7.4200 | Freq: Once | INTRAVENOUS | Status: AC
  Administered 2022-10-05: 7.42 via INTRAVENOUS

## 2022-10-08 ENCOUNTER — Telehealth: Payer: Self-pay | Admitting: *Deleted

## 2022-10-08 NOTE — Telephone Encounter (Signed)
Spoke with Misty Lowe this morning. Patient states she is having edema, redness in the area of vulva biopsy and it feels "hard"  Pt also states she has noticed the area of redness and swelling goes beyond the area of the biopsy on vulva. Pt is rating her pain 5/10 and is taking advil and tylenol. Pt reports having a low grade fever 99.2-99.8. Denies vaginal bleeding. Advised patient to alternate ice pack and warm compresses to area. She state her surgery is scheduled for October 3rd at [facility]. Advised pt her message would be relayed to provider and the office would call back with recommendations.

## 2022-10-09 ENCOUNTER — Inpatient Hospital Stay: Payer: BC Managed Care – PPO | Attending: Psychiatry | Admitting: Gynecologic Oncology

## 2022-10-09 VITALS — BP 139/80 | HR 96 | Temp 98.3°F | Resp 20 | Wt 141.0 lb

## 2022-10-09 DIAGNOSIS — C519 Malignant neoplasm of vulva, unspecified: Secondary | ICD-10-CM | POA: Diagnosis not present

## 2022-10-09 DIAGNOSIS — R102 Pelvic and perineal pain: Secondary | ICD-10-CM | POA: Diagnosis not present

## 2022-10-09 NOTE — Telephone Encounter (Signed)
Spoke with Ms. Dorriety and scheduled her an appointment today to see Warner Mccreedy, NP at 1:45. Pt states the area on her vulva is still bothering her, especially at night. Pt states it, "throbs"! She is taking tylenol pm to help with the discomfort at night so she can sleep. Asked pt is warm compresses have helped. She states, "I haven't tried the compresses" I soaked in the tub last evening instead. Pt states the area is red, edematous and "cyst like" Pt denies fever this morning, but is still taking tylenol. Pt agrees to date and time of appointment and thanked the office for calling.

## 2022-10-09 NOTE — Patient Instructions (Signed)
Your PET scan results are still pending. You will be contacted when these return.   No infection noted on today's exam.   Continue using lidocaine as needed. You can apply a swipe of neosporin to the left biopsy area to assist with healing. If this is more irritating, you do not have to use that.   If you develop more, pain, redness, pus like drainage or increase in drainage, call the office to be seen.   Please call for any needs, concerns, or new persistent symptoms.

## 2022-10-09 NOTE — Progress Notes (Signed)
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 She recently had a PET scan on 10/05/2022. She presents to the office today for evaluation of her vulva due to increased pain, erythema, and edema in the area.   Since Wednesday of last week, she reports having a low grade temp around 99.8. No URI symptoms. Was recently treated for UTI and completed 10 day course. Urinary symptoms are a little better. Right sided vulvar biopsy site has been healing well. Left sided vulva is tender and feels firm/knotted on the lower aspect. Was at GI appt when bleeding starting and was unable to be seen due to seeking evaluation in the ER. No heavy bleeding, no abnormal discharge from the left vulvar sit. Taking ibuprofen as needed and tylenol for discomfort. OR is planned at [facility] on 10/3. Having bowel movements.   Exam: Alert, oriented, in no acute distress. Lungs clear. Heart regular in rate and rhythm. With chaperone present, the vulva is inspected. Right vulva is well healed. Left vulva with 0.25 cm open wound without abnormal drainage, mild erythema around the wound border, no extending erythema from these borders, no fluctuance or collections noted.  Assessment/Plan: No evidence of infection on today's exam. Advised to continue using topical lidocaine as needed, tylenol and ibup prn. Her PET scan results are still pending. She will be contacted with this when available. Reportable signs and symptoms reviewed. She is advised to call for any needs, worsening symptoms. She will be contacted by [facility] with additional information pertaining to her surgery.

## 2022-10-12 ENCOUNTER — Other Ambulatory Visit: Payer: Self-pay | Admitting: Gynecologic Oncology

## 2022-10-12 ENCOUNTER — Telehealth: Payer: Self-pay

## 2022-10-12 DIAGNOSIS — L0390 Cellulitis, unspecified: Secondary | ICD-10-CM

## 2022-10-12 DIAGNOSIS — L539 Erythematous condition, unspecified: Secondary | ICD-10-CM

## 2022-10-12 DIAGNOSIS — F4310 Post-traumatic stress disorder, unspecified: Secondary | ICD-10-CM | POA: Diagnosis not present

## 2022-10-12 DIAGNOSIS — C519 Malignant neoplasm of vulva, unspecified: Secondary | ICD-10-CM

## 2022-10-12 DIAGNOSIS — R102 Pelvic and perineal pain: Secondary | ICD-10-CM

## 2022-10-12 MED ORDER — CLINDAMYCIN HCL 150 MG PO CAPS
150.0000 mg | ORAL_CAPSULE | Freq: Three times a day (TID) | ORAL | 0 refills | Status: DC
Start: 2022-10-12 — End: 2022-11-14

## 2022-10-12 MED ORDER — HYDROCODONE-ACETAMINOPHEN 5-325 MG PO TABS
1.0000 | ORAL_TABLET | Freq: Four times a day (QID) | ORAL | 0 refills | Status: AC | PRN
Start: 2022-10-12 — End: ?

## 2022-10-12 NOTE — Telephone Encounter (Signed)
Spoke with Ms. Mininni who called the office back. Pt states she hadn't heard back from the office. Pt states the area of pain on her vulva has expanded and  is more red. She denies fever,  bleeding and discharge.  Pt also states she is up every 2-3 hours during the night to empty her bladder, sometimes very little and other times a lot. Pt states she is also getting up frequently due to the pain on her vulva area. Pt is still using lidocaine gel, alternating tylenol and advil and took the last hydrocodone, she states the hydrocodone works well but does wear off. Pt reports having burning on urination and burning on vulva, she continues to use peri bottle. Advised patient to wear loose fitting undergarments and nothing snug that would irritate vulva tissue.   Relayed message from Warner Mccreedy, NP that a Rx for hydrocodone and antibiotic would be sent in to pharmacy. Pt verbalized understanding.

## 2022-10-12 NOTE — Progress Notes (Signed)
See note from RN. Patient is reporting increase in erythema and pain around vulvar cancer site. Previously seen in the week with no evidence of infection at that time. Given worsening symptoms and patient's concerns, will plan for initiation of antibiotics. Given PCN allergy, will plan for clindamycin. Patient situation discussed with Dr. Pricilla Holm who agrees with the plan. Hydrocodone refilled.

## 2022-10-12 NOTE — Telephone Encounter (Signed)
Pt called stating she was seen on Tuesday by Warner Mccreedy NP. She is calling with an update.  She states since her visit she has noticed inflammation and pain, 5-6/10 on pain scale, right below the biopsy site. There is no drainage or bleeding. She also did an at home UTI kit and it looks to be positive. Pain, pressure and burning, and frequency with urination. She has taken a Hydrocodone last night around 6:00 and once it was in her system it only lasted about an hour, with pain coming right back. She states she is only able to sleep in 2 hour increments d/t the pain as well.

## 2022-10-15 ENCOUNTER — Telehealth: Payer: Self-pay

## 2022-10-15 NOTE — Telephone Encounter (Signed)
Spoke with Misty Lowe who called the office and was asking about her PET Scan. Advised patient that Dr. Alvester Morin is aware of the results and would be calling the patient back. Pt also states her symptoms have decreased and she is feeling a little better. She is emptying her bladder not as often and the frequency has decreased as well as the pain on her vulvar. Pt continues to take the pain medicine. Pt has no further concerns at this time.

## 2022-10-15 NOTE — Telephone Encounter (Signed)
Pt calling stating she had a PET scan on 10/05/22 and is wanting her results.

## 2022-10-16 ENCOUNTER — Ambulatory Visit: Payer: No Typology Code available for payment source | Admitting: Physician Assistant

## 2022-10-16 ENCOUNTER — Telehealth: Payer: Self-pay | Admitting: Psychiatry

## 2022-10-16 NOTE — Telephone Encounter (Signed)
Called pt with PET results. Continue with surgery as planned at [facility].

## 2022-10-18 ENCOUNTER — Telehealth: Payer: Self-pay | Admitting: Physician Assistant

## 2022-10-18 NOTE — Telephone Encounter (Signed)
Patient called in to reschedule he appointment.

## 2022-10-22 DIAGNOSIS — F321 Major depressive disorder, single episode, moderate: Secondary | ICD-10-CM | POA: Diagnosis not present

## 2022-10-22 DIAGNOSIS — G4701 Insomnia due to medical condition: Secondary | ICD-10-CM | POA: Diagnosis not present

## 2022-10-22 DIAGNOSIS — F411 Generalized anxiety disorder: Secondary | ICD-10-CM | POA: Diagnosis not present

## 2022-10-22 DIAGNOSIS — F4311 Post-traumatic stress disorder, acute: Secondary | ICD-10-CM | POA: Diagnosis not present

## 2022-10-24 DIAGNOSIS — F4310 Post-traumatic stress disorder, unspecified: Secondary | ICD-10-CM | POA: Diagnosis not present

## 2022-10-26 DIAGNOSIS — Z1231 Encounter for screening mammogram for malignant neoplasm of breast: Secondary | ICD-10-CM | POA: Diagnosis not present

## 2022-10-26 DIAGNOSIS — Z01419 Encounter for gynecological examination (general) (routine) without abnormal findings: Secondary | ICD-10-CM | POA: Diagnosis not present

## 2022-10-30 DIAGNOSIS — F4310 Post-traumatic stress disorder, unspecified: Secondary | ICD-10-CM | POA: Diagnosis not present

## 2022-11-01 DIAGNOSIS — E8729 Other acidosis: Secondary | ICD-10-CM | POA: Diagnosis not present

## 2022-11-01 DIAGNOSIS — K219 Gastro-esophageal reflux disease without esophagitis: Secondary | ICD-10-CM | POA: Diagnosis not present

## 2022-11-01 DIAGNOSIS — Z880 Allergy status to penicillin: Secondary | ICD-10-CM | POA: Diagnosis not present

## 2022-11-01 DIAGNOSIS — Z1152 Encounter for screening for COVID-19: Secondary | ICD-10-CM | POA: Diagnosis not present

## 2022-11-01 DIAGNOSIS — C519 Malignant neoplasm of vulva, unspecified: Secondary | ICD-10-CM | POA: Diagnosis not present

## 2022-11-01 DIAGNOSIS — H9190 Unspecified hearing loss, unspecified ear: Secondary | ICD-10-CM | POA: Diagnosis not present

## 2022-11-01 DIAGNOSIS — C774 Secondary and unspecified malignant neoplasm of inguinal and lower limb lymph nodes: Secondary | ICD-10-CM | POA: Diagnosis not present

## 2022-11-01 DIAGNOSIS — N766 Ulceration of vulva: Secondary | ICD-10-CM | POA: Diagnosis not present

## 2022-11-01 DIAGNOSIS — E872 Acidosis, unspecified: Secondary | ICD-10-CM | POA: Diagnosis not present

## 2022-11-01 DIAGNOSIS — I959 Hypotension, unspecified: Secondary | ICD-10-CM | POA: Diagnosis not present

## 2022-11-01 DIAGNOSIS — F4310 Post-traumatic stress disorder, unspecified: Secondary | ICD-10-CM | POA: Diagnosis not present

## 2022-11-05 ENCOUNTER — Telehealth: Payer: Self-pay

## 2022-11-05 NOTE — Telephone Encounter (Signed)
Per Dr. [place] Blas last note.  Pt is scheduled for a post op appointment on 11/19/22 @ 1:30 with Dr. Alvester Morin

## 2022-11-05 NOTE — Telephone Encounter (Signed)
-----   Message from [place], [place] sent at 11/02/2022  6:00 PM EDT ----- Regarding: postop visit Can this pt be scheduled in 2-3 weeks for a postop visit with me?  Thanks, [employer]

## 2022-11-06 ENCOUNTER — Ambulatory Visit: Payer: BC Managed Care – PPO | Admitting: Physician Assistant

## 2022-11-07 ENCOUNTER — Telehealth: Payer: Self-pay

## 2022-11-07 NOTE — Transitions of Care (Post Inpatient/ED Visit) (Signed)
11/07/2022  Name: Misty Lowe MRN: [id number] DOB: 02-Jul-1975  Today's TOC FU Call Status: Today's TOC FU Call Status:: Successful TOC FU Call Completed TOC FU Call Complete Date: 11/07/22 Patient's Name and Date of Birth confirmed.  Transition Care Management Follow-up Telephone Call Date of Discharge: 11/03/22 Discharge Facility: Other [facility]) Name of Other (Non[facility]) Discharge Facility: [facility] Type of Discharge: Inpatient Admission Primary Inpatient Discharge Diagnosis:: Vulvar Cancer How have you been since you were released from the hospital?: Same (numbness medication wearing , off, little more discomfort Surgenon recc not using a sitz vath ot lidocaine until she was seen for surgical follow-up. Enc her to utlilze pain medication and reviewed potential medication schedule) Any questions or concerns?: No  Items Reviewed: Medications obtained,verified, and reconciled?: Yes (Medications Reviewed) Any new allergies since your discharge?: No Dietary orders reviewed?: Yes Type of Diet Ordered:: Reg toleratimg meals well no nausea Do you have support at home?: Yes People in Home: child(ren), adult, sibling(s) Name of Support/Comfort Primary Source: she has 2 children and her sister is staying with her for several weeks  Medications Reviewed Today: Medications Reviewed Today     Reviewed by Johnnette Barrios, RN (Registered Nurse) on 11/07/22 at 1048  Med List Status: <None>   Medication Order Taking? Sig Documenting Provider Last Dose Status Informant  acetaminophen (TYLENOL) 500 MG tablet [id number] Yes Take 1,000 mg by mouth every 8 (eight) hours as needed for mild pain. X 7 days [provider] Taking Active   albuterol (VENTOLIN HFA) 108 (90 Base) MCG/ACT inhaler [id number] Yes Inhale 1-2 puffs into the lungs every 6 (six) hours as needed for wheezing. [provider] Taking Active   BLISOVI FE 1/20 1-20 MG-MCG tablet [id number] Yes Take 1  tablet by mouth daily. [provider] Taking Active   brompheniramine-pseudoephedrine-DM 30-2-10 MG/5ML syrup [id number] Yes Take 5-10 mLs by mouth every 6 (six) hours as needed. Bethanie Dicker, NP Taking Active   clindamycin (CLEOCIN) 150 MG capsule [id number] No Take 1 capsule (150 mg total) by mouth 3 (three) times daily.  Patient not taking: Reported on 11/07/2022   Warner Mccreedy D, NP Not Taking Active   HYDROcodone-acetaminophen (NORCO/VICODIN) 5-325 MG tablet [id number] No Take 1 tablet by mouth every 6 (six) hours as needed for moderate pain or severe pain. Do not take and drive.  Patient not taking: Reported on 11/07/2022   Warner Mccreedy D, NP Not Taking Active   ibuprofen (ADVIL) 200 MG tablet [id number] Yes Take 600 mg by mouth every 6 (six) hours as needed for mild pain. [provider] Taking Active   omeprazole (PRILOSEC) 40 MG capsule [id number] Yes Take 1 capsule (40 mg total) by mouth daily. Bethanie Dicker, NP Taking Active   ondansetron [facility]) 4 MG tablet [id number] Yes Take 1 tablet (4 mg total) by mouth every 8 (eight) hours as needed for nausea or vomiting. Kara Dies, NP Taking Active   oxyCODONE (OXY IR/ROXICODONE) 5 MG immediate release tablet [id number] Yes Take 5 mg by mouth every 4 (four) hours as needed for severe pain. [provider] Taking Active   Sennosides (SENNA) 8.6 MG CAPS [id number] Yes Take 1 capsule by mouth daily. [provider] Taking Active   venlafaxine XR (EFFEXOR-XR) 150 MG 24 hr capsule [id number] Yes Take 150 mg by mouth every morning. [provider] Taking Active             Home Care and Equipment/Supplies: Were Home Health Services Ordered?:  NA Any new equipment or medical supplies ordered?: NA  Functional Questionnaire: Do you need assistance with bathing/showering or dressing?: Yes (temp due to recent procedure) Do you need assistance with meal preparation?: No Do you need assistance with  eating?: No Do you have difficulty maintaining continence: No Do you need assistance with getting out of bed/getting out of a chair/moving?: Yes (min assist with transfer due to recent procedure) Do you have difficulty managing or taking your medications?: No  Follow up appointments reviewed: PCP Follow-up appointment confirmed?: No MD Provider Line Number:[phone] Given: No (She was not told to schedule an appt, she will follow-up with PCP and schedule if indicated) Specialist Hospital Follow-up appointment confirmed?: Yes Date of Specialist follow-up appointment?: 11/19/22 Follow-Up Specialty Provider:: Dr Alesia Banda( surgeon) Do you need transportation to your follow-up appointment?: No (Sister will transport) Do you understand care options if your condition(s) worsen?: Yes-patient verbalized understanding  SDOH Interventions Today    Flowsheet Row Most Recent Value  SDOH Interventions   Stress Interventions Patient Declined  [Several personal issues affecting daily life. She has support w/ family and SW]       Goals Addressed             This Visit's Progress    TOC Care Plan       Current Barriers:  Chronic Disease Management support and education needs related to Vulvar cancer and recent surgery to remove vulva and clitorectomy   RNCM Clinical Goal(s):  Patient will work with the Care Management team over the next 30 days to address Transition of Care Barriers: Medication Management Support at home Provider appointments take all medications exactly as prescribed and will call provider for medication related questions as evidenced by pain well managed , no episodes of constipation, no s/s infection at surgical site  attend all scheduled medical appointments: with PCP if indicated and post-op follow-up with surgeon as evidenced by no missed appointments  demonstrate ongoing health management independence as evidenced by collaborative care r/t surgery . Medication management  for pain control. Proper site care and no s/s infection at surgical site , Practicing stress management techniques   through collaboration with RN Care manager, provider, and care team.   Interventions: Evaluation of current treatment plan related to  self management and patient's adherence to plan as established by provider  Transitions of Care:  New goal. Doctor Visits  - discussed the importance of doctor visits  Patient Goals/Self-Care Activities: Participate in Transition of Care Program/Attend [facility] scheduled calls Take all medications as prescribed Attend all scheduled provider appointments Perform all self care activities independently  Perform IADL's (shopping, preparing meals, housekeeping, managing finances) independently  Follow Up Plan:  Telephone follow up appointment with care management team member scheduled for:  11/13/22 @ 3pm Follow up with provider re: PCP post hospitalization follow-up and surgical follow-up Dr Alvester Morin 11/19/22 @ 1:30pm           Susa Loffler , BSN, RN Care Management Coordinator [facility]   [facility] [email or web address] Direct Dial: [phone]

## 2022-11-09 ENCOUNTER — Telehealth: Payer: Self-pay | Admitting: Nurse Practitioner

## 2022-11-09 DIAGNOSIS — F4310 Post-traumatic stress disorder, unspecified: Secondary | ICD-10-CM | POA: Diagnosis not present

## 2022-11-09 NOTE — Telephone Encounter (Signed)
Patient called and wanted Arville Lime to know she had cancer surgery at [facility] on 11/01/2022.

## 2022-11-13 ENCOUNTER — Ambulatory Visit: Payer: Self-pay | Admitting: *Deleted

## 2022-11-13 NOTE — Patient Outreach (Signed)
Care Management  Transitions of Care Program Transitions of Care Post-discharge week 2   11/13/2022 Name: Misty Lowe MRN: [id number] DOB: [birth year]  Subjective: Misty Lowe is a 47 y.o. year old female who is a primary care patient of Bethanie Dicker, NP. The Care Management team Engaged with patient Engaged with patient by telephone to assess and address transitions of care needs.   Consent to Services:  Patient was given information about care management services, agreed to services, and gave verbal consent to participate.   Assessment:     Patient stated that she is having some swelling and a harden area around the clitoris area. Patient stated pain has decreased. RN instructed patient to call the surgeon and make her aware. RN will follow up [phone] 2;00.      SDOH Interventions    Flowsheet Row Telephone from 11/07/2022 in [place] [facility] DEPARTMENT  SDOH Interventions   Stress Interventions Patient Declined  [Several personal issues affecting daily life. She has support w/ family and SW]        Goals Addressed   None     Plan: Telephone follow up appointment with care management team member scheduled for: 11/21/2022 2:00  Gean Maidens BSN RN [facility] Care Management [phone]

## 2022-11-14 ENCOUNTER — Telehealth: Payer: Self-pay | Admitting: *Deleted

## 2022-11-14 NOTE — Telephone Encounter (Signed)
Spoke with Ms. Arras who returned the office call for meaningful use for follow up appt. With Dr. Alvester Morin on Monday, Oct. 21 st. Pt states she reached out to the [facility] gyn/onc office and left a message as well. Pt is complaining of right side vulva redness and irritation that she says was there before her surgery but seems to have developed more redness.  Pt's left vulva area is healing well from surgery but states she has an area just above the clitoris close to incision site that feels like a hard knot under the skin. Pt denies redness, swelling and or pain from the area. Pt denies fever, chills and all urinary symptoms. Pt is taking tylenol and ibuprofen for the discomfort. Advised patient that her message would be relayed to provider and office would call back with recommendations.

## 2022-11-16 ENCOUNTER — Encounter: Payer: Self-pay | Admitting: Obstetrics and Gynecology

## 2022-11-19 ENCOUNTER — Inpatient Hospital Stay: Payer: BC Managed Care – PPO | Attending: Psychiatry | Admitting: Psychiatry

## 2022-11-19 VITALS — BP 126/78 | HR 95 | Resp 20 | Wt 147.0 lb

## 2022-11-19 DIAGNOSIS — C519 Malignant neoplasm of vulva, unspecified: Secondary | ICD-10-CM | POA: Diagnosis not present

## 2022-11-19 DIAGNOSIS — Z7189 Other specified counseling: Secondary | ICD-10-CM

## 2022-11-19 DIAGNOSIS — L0390 Cellulitis, unspecified: Secondary | ICD-10-CM

## 2022-11-19 MED ORDER — SULFAMETHOXAZOLE-TRIMETHOPRIM 800-160 MG PO TABS: 1.0000 | ORAL_TABLET | Freq: Two times a day (BID) | ORAL | 0 refills | Status: AC

## 2022-11-19 NOTE — Patient Instructions (Signed)
It was a pleasure to see you in clinic today. - We will reach out to [facility] about the trial. - I wrote down for you the other treatment options to consider  Thank you very much for allowing me to provide care for you today.  I appreciate your confidence in choosing our Gynecologic Oncology team at [facility].  If you have any questions about your visit today please call our office or send [place] a MyChart message and we will get back to you as soon as possible.

## 2022-11-19 NOTE — Progress Notes (Unsigned)
Gynecologic Oncology Return Clinic Visit  Date of Service: 11/19/2022 Referring Provider: [name], MD   Assessment & Plan: Misty Lowe is a 47 y.o. 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  Postop: - Pt recovering well from surgery and healing appropriately postoperatively - Intraoperative findings and pathology results reviewed. - Ongoing postoperative expectations and precautions reviewed.  -Given some erythema and swelling at superior aspect of vulvar incision, will prescribe short course of antibiotics to cover for cellulitis.  Patient with penicillin allergy.  Bactrim x 5 days sent to pharmacy.  Vulvar cancer: -Reviewed the nature of vulvar cancer. - Reviewed patient's pathology results and intraoperative findings in detail. - Reviewed standard of care to include completion lymph node dissection followed by radiation +/- cisplatin. - Additionally reviewed option for clinical trial at [facility] which is evaluating cisRT without completion lymph node dissection.  Based on my review of inclusion/exclusion criteria I think that she may be a candidate. - Additionally, CPS score ordered on pathology.  If positive, could consider addition of pembrolizumab to standard of care radiation treatment as above following lymph node dissection based on data from cervical cancer.  Reviewed limitations of this extrapolation of data. - Pros/cons of each reviewed - Pt may be interested in study at [facility]. Will send her records to see if she qualifies.  - Will follow-up CPS score.  RTC pending final treatment plan (after evaluation at [facility]).  [name], MD Gynecologic Oncology   Medical Decision Making I personally spent  TOTAL 45 minutes face-to-face and non-face-to-face in the care of  this patient, which includes all pre, intra, and post visit time on the date of service. The discussion of diagnosis and treatment of vulvar cancer is beyond the scope of routine postoperative care.   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    09/19/2022 Initial Diagnosis   Vulva cancer (HCC)   09/24/2022 Pathology Results   Vulvar biopsy: A. RIGHT LABIA MINORA, BIOPSY:  Mild squamous dysplasia with parakeratosis (LSIL, VIN 1)   B. LEFT LABIA MINORA, BIOPSY:  Invasive moderately differentiated squamous cell carcinoma (see comment)   COMMENT:  The biopsy is maloriented and shows an invasive squamous carcinoma with  a thickness of 1.3 mm. There is no associated overlying epithelium.  The  biopsy shows only rare detached/denuded fragments of squamous  epithelium.    11/01/2022 Surgery   radical partial vulvectomy including clitorectomy, left inguinofemoral sentinel lymph node evaluation and biopsy    11/01/2022 Cancer Staging   Staging form: Vulva, AJCC V9 - Pathologic stage from 11/01/2022: FIGO Stage IIIC (pT1b, pN1c(sn), cM0) - Signed by [name], MD on 11/20/2022 Histopathologic type: Squamous cell carcinoma, NOS Stage prefix: Initial diagnosis Method of lymph node assessment: Sentinel lymph node biopsy Histologic grade (G): G2 Histologic grading system: 3 grade system   11/01/2022 Pathology Results      A: Lymph node, left groin, sentinel lymph node excision: - Positive for metastatic carcinoma. - Positive for extranodal extension.    B: Vulva, left anterior, left radical vulvectomy:  - Invasive squamous cell carcinoma, well to moderately  differentiated. 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  - All margins negative for carcinoma or dVIN.    C: Vulva, clitoral margin, biopsy: - Benign skin, negative for carcinoma or dVIN.     Comment: S100 performed on Block B6 highlights the nerve tissue. This is supportive of the presence of clitoral tissue.   P53 is performed on blocks B4 and B5, and demonstrates null pattern (mutant) expression in the areas of dVIN and carcinoma. Ki67 also performed on those blocks indicate increased proliferation in dVIN. P16 is patchy positive. The immunophenotype supports the above diagnosis and argue against an HPV driven process.    In part C, p53 demonstrates wild type expression and Ki67 demonstrates low proliferation, which argues against the presence of dVIN in this biopsy.        This electronic signature is attestation that the pathologist personally reviewed the submitted material(s) and the final diagnosis reflects that evaluation.  Electronically signed by [name], MD on 11/12/2022 at  6:26 PM  VULVA VULVA - All Specimens 9th Edition - Protocol posted: 07/18/2022  SPECIMEN    Procedure:    Radical vulvectomy  TUMOR    Tumor Focality:    Unifocal    Tumor Site:    Left vulva      :    Labium minus    Tumor Site:    Clitoris    Tumor Size:    Greatest Dimension (Centimeters): 2.1 cm    Histologic Type:    Squamous cell carcinoma, HPV-independent    Histologic Grade:    G2, moderately differentiated    Depth of Invasion (Millimeters) (FIGO 2021 method):    7 mm    Depth of Invasion (Millimeters) (conventional method):    7 mm    Other Tissue / Organ Involvement:    Not applicable    Lymphatic and / or Vascular Invasion:    Not identified  MARGINS    Margin Status for Invasive Carcinoma:    All margins negative for invasive carcinoma      Closest Margin(s) to Invasive Carcinoma:    Peripheral: 9:00 lateral margin      Closest  Margin(s) to Invasive Carcinoma:    Deep      Distance from Invasive Carcinoma to Closest Margin:    7 mm    Margin Status for Precursor Lesions of Squamous Cell Carcinoma:    All margins negative for squamous precursor lesions  REGIONAL LYMPH NODES    Regional Lymph Node Status:          :    Tumor present in regional lymph node(s)        Number of Nodes with Metastasis Greater than 5 mm:    1        Number of Nodes with Metastasis 5 mm or Less but Greater than 2 mm:    0        Number of Nodes with Micrometastasis:    0        Size of Largest Nodal Metastatic Deposit:    22 mm        Nodal Site(s) with Tumor:    Left groin sentinel lymph node        Additional Lymph Node Findings:    Extranodal extension / extracapsular spread      Total Number of Lymph Nodes Examined:    1      Number of Sentinel Nodes Examined:    1  pTNM CLASSIFICATION (AJCC  Version 9)    Reporting of pT, pN, and (when applicable) pM categories is based on information available to the pathologist at the time the report is issued. As per the AJCC (Chapter 1, 8th Ed.) it is the managing physician's responsibility to establish the final pathologic stage based upon all pertinent information, including but potentially not limited to this pathology report.    pT Category:    pT1b    pN Category:    pN1c    N Suffix:    (sn)  FIGO STAGE    FIGO Stage:    IIIC  ADDITIONAL FINDINGS    Additional Findings:    Differentiated vulvar intraepithelial neoplasia (dVIN)  SPECIAL STUDIES    p16 Immunohistochemistry:    Negative    p53 Immunohistochemistry:    Abnormal      :    Absent / null       Interval History: Pt reports that she is recovering well from surgery. She is using primarily Tylenol and Motrin for pain, only occasional oxycodone. She is eating and drinking well. She is voiding without issue and having regular bowel movements.   She reports some swelling and redness on her mons that has maybe gotten a little  bit bigger than when we last spoke.  Feels sensitive on the right side of the labia.  Past Medical/Surgical History: Past Medical History:  Diagnosis Date   Endometriosis    Hearing impairment    Hiatal hernia    PONV (postoperative nausea and vomiting)     Past Surgical History:  Procedure Laterality Date   CESAREAN SECTION     CESAREAN SECTION  08/28/2011   Procedure: CESAREAN SECTION;  Surgeon: Philip Aspen, DO;  Location: [facility];  Service: Gynecology;  Laterality: N/A;   DIAGNOSTIC LAPAROSCOPY  2007   for pain, per pt diagnosed with endometriosis   DILATION AND CURETTAGE OF UTERUS     for SAB   EAR MASTOIDECTOMY W/ COCHLEAR IMPLANT W/ LANDMARK      Family History  Problem Relation Age of Onset   Other Neg Hx     Social History   Socioeconomic History   Marital status: Legally Separated    Spouse name: Not on file   Number of children: Not on file   Years of education: Not on file   Highest education level: Not on file  Occupational History   Not on file  Tobacco Use   Smoking status: Never   Smokeless tobacco: Not on file  Substance and Sexual Activity   Alcohol use: No   Drug use: No   Sexual activity: Yes  Other Topics Concern   Not on file  Social History Narrative   Not on file   Social Determinants of Health   Financial Resource Strain: Low Risk  (11/02/2022)   Received from [facility]   Overall Financial Resource Strain (CARDIA)    Difficulty of Paying Living Expenses: Not very hard  Food Insecurity: No Food Insecurity (11/02/2022)   Received [facility]   Hunger Vital Sign    Worried About Running Out of Food in the Last Year: Never true    Ran Out of Food in the Last Year: Never true  Transportation Needs: No Transportation Needs (11/02/2022)   Received from [facility] - Transportation    Lack of Transportation (Medical): No    Lack of Transportation (Non-Medical): No  Physical Activity: Not on file  Stress:  Stress Concern Present (11/07/2022)   [employer] of Occupational [employer] - Occupational Stress Questionnaire    Feeling of Stress : Very much  Social Connections: Not on file    Current Medications:  Current Outpatient Medications:    acetaminophen (TYLENOL) 500 MG tablet, Take 1,000 mg by mouth every 8 (eight) hours as needed for mild pain. X 7 days, Disp: , Rfl:    albuterol (VENTOLIN HFA) 108 (90 Base) MCG/ACT inhaler, Inhale 1-2 puffs into the lungs every 6 (six) hours as needed for wheezing., Disp: , Rfl:    BLISOVI FE 1/20 1-20 MG-MCG tablet, Take 1 tablet by mouth daily., Disp: , Rfl:    HYDROcodone-acetaminophen (NORCO/VICODIN) 5-325 MG tablet, Take 1 tablet by mouth every 6 (six) hours as needed for moderate pain or severe pain. Do not take and drive., Disp: 20 tablet, Rfl: 0   ibuprofen (ADVIL) 200 MG tablet, Take 600 mg by mouth every 6 (six) hours as needed for mild pain., Disp: , Rfl:    omeprazole (PRILOSEC) 40 MG capsule, Take 1 capsule (40 mg total) by mouth daily., Disp: 90 capsule, Rfl: 3   Sennosides (SENNA) 8.6 MG CAPS, Take 1 capsule by mouth daily., Disp: , Rfl:    sulfamethoxazole-trimethoprim (BACTRIM DS) 800-160 MG tablet, Take 1 tablet by mouth 2 (two) times daily for 5 days., Disp: 10 tablet, Rfl: 0   venlafaxine XR (EFFEXOR-XR) 150 MG 24 hr capsule, Take 150 mg by mouth every morning., Disp: , Rfl:    oxyCODONE (OXY IR/ROXICODONE) 5 MG immediate release tablet, Take 5 mg by mouth every 4 (four) hours as needed for severe pain. (Patient not taking: Reported on 11/14/2022), Disp: , Rfl:   Review of Symptoms: Complete 10-system review is positive for: Headache, vaginal bleeding, itching, problem walking, swollen gland, lump above surgical incision of vulva  Physical Exam: BP 126/78 (BP Location: Left Arm, Patient Position: Sitting)   Pulse 95   Resp 20   Wt 147 lb (66.7 kg)   SpO2 100%   BMI 27.78 kg/m  General: Alert, oriented, no acute distress. HEENT:  Normocephalic, atraumatic. Neck symmetric without masses. Sclera anicteric.  Chest: Normal work of breathing. Clear to auscultation bilaterally.   Cardiovascular: Regular rate and rhythm, no murmurs. Abdomen: Soft, nontender.  Normoactive bowel sounds.   Groin: Well-healing left groin incision with glue.  Some induration underlying incision but no erythema, drainage or fluctuance.   Extremities: Grossly normal range of motion.  Warm, well perfused.  No edema bilaterally. Skin: No rashes or lesions noted. GU: External genitalia with evidence of recent left anterior vulvectomy.  Area of erythema and firmness anterior to superior portion of incision.  Mild tenderness to palpation of this area.  No drainage from incision.  Incision well-approximated with no areas of separation.  Exam chaperoned by Kimberly [place], CMA   Laboratory & Radiologic Studies: Surgical pathology (11/01/22): Diagnosis   A: Lymph node, left groin, sentinel lymph node excision: - Positive for metastatic carcinoma. - Positive for extranodal extension.    B: Vulva, left anterior, left radical vulvectomy:  - Invasive squamous cell carcinoma, well to moderately differentiated. 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  - All margins negative for carcinoma or dVIN.    C: Vulva, clitoral margin, biopsy: - Benign skin, negative for carcinoma or dVIN.     Comment: S100 performed on Block B6 highlights the nerve tissue. This is supportive of the presence of clitoral tissue.   P53 is performed on  blocks B4 and B5, and demonstrates null pattern (mutant) expression in the areas of dVIN and carcinoma. Ki67 also performed on those blocks indicate increased proliferation in dVIN. P16 is patchy positive. The immunophenotype supports the above diagnosis and argue against an HPV driven process.    In part C, p53 demonstrates wild type expression and Ki67 demonstrates low proliferation, which argues  against the presence of dVIN in this biopsy.        This electronic signature is attestation that the pathologist personally reviewed the submitted material(s) and the final diagnosis reflects that evaluation.  Electronically signed by [name], MD on 11/12/2022 at  6:26 PM  Synoptic Report VULVA VULVA - All Specimens 9th Edition - Protocol posted: 07/18/2022  SPECIMEN    Procedure:    Radical vulvectomy  TUMOR    Tumor Focality:    Unifocal    Tumor Site:    Left vulva      :    Labium minus    Tumor Site:    Clitoris    Tumor Size:    Greatest Dimension (Centimeters): 2.1 cm    Histologic Type:    Squamous cell carcinoma, HPV-independent    Histologic Grade:    G2, moderately differentiated    Depth of Invasion (Millimeters) (FIGO 2021 method):    7 mm    Depth of Invasion (Millimeters) (conventional method):    7 mm    Other Tissue / Organ Involvement:    Not applicable    Lymphatic and / or Vascular Invasion:    Not identified  MARGINS    Margin Status for Invasive Carcinoma:    All margins negative for invasive carcinoma      Closest Margin(s) to Invasive Carcinoma:    Peripheral: 9:00 lateral margin      Closest Margin(s) to Invasive Carcinoma:    Deep      Distance from Invasive Carcinoma to Closest Margin:    7 mm    Margin Status for Precursor Lesions of Squamous Cell Carcinoma:    All margins negative for squamous precursor lesions  REGIONAL LYMPH NODES    Regional Lymph Node Status:          :    Tumor present in regional lymph node(s)        Number of Nodes with Metastasis Greater than 5 mm:    1        Number of Nodes with Metastasis 5 mm or Less but Greater than 2 mm:    0        Number of Nodes with Micrometastasis:    0        Size of Largest Nodal Metastatic Deposit:    22 mm        Nodal Site(s) with Tumor:    Left groin sentinel lymph node        Additional Lymph Node Findings:    Extranodal extension / extracapsular spread      Total Number of  Lymph Nodes Examined:    1      Number of Sentinel Nodes Examined:    1  pTNM CLASSIFICATION (AJCC Version 9)    Reporting of pT, pN, and (when applicable) pM categories is based on information available to the pathologist at the time the report is issued. As per the AJCC (Chapter 1, 8th Ed.) it is the managing physician's responsibility to establish the final pathologic stage based upon all pertinent information, including but potentially not limited  to this pathology report.    pT Category:    pT1b    pN Category:    pN1c    N Suffix:    (sn)  FIGO STAGE    FIGO Stage:    IIIC  ADDITIONAL FINDINGS    Additional Findings:    Differentiated vulvar intraepithelial neoplasia (dVIN)  SPECIAL STUDIES    p16 Immunohistochemistry:    Negative    p53 Immunohistochemistry:    Abnormal      :    Absent / null

## 2022-11-20 ENCOUNTER — Encounter: Payer: Self-pay | Admitting: Psychiatry

## 2022-11-20 DIAGNOSIS — C519 Malignant neoplasm of vulva, unspecified: Secondary | ICD-10-CM | POA: Insufficient documentation

## 2022-11-21 ENCOUNTER — Ambulatory Visit: Payer: Self-pay | Admitting: *Deleted

## 2022-11-21 ENCOUNTER — Ambulatory Visit: Payer: BC Managed Care – PPO | Admitting: Physician Assistant

## 2022-11-21 NOTE — Patient Outreach (Signed)
Care Coordination   Follow Up Visit Note   11/21/2022 Name: Misty Lowe MRN: [id number] DOB: Aug 01, 1975  Misty Lowe is a 47 y.o. year old female who sees Bethanie Dicker, NP for primary care. I spoke with  Ivonne Andrew by phone today.  What matters to the patients health and wellness today? RN following up on patient regarding the swelling. Per patient she stated that she did follow up with the Dr and she said it was a hematoma.  Patient is trying to decide the 3 options that was given by her Dr. Per patient the lymph node did test positive stage 3.  Patient stated that she will go to [facility] and see what they say since the other options would involve surgery.   SDOH assessments and interventions completed:  Yes   Care Coordination Interventions:  Yes, provided   Follow up plan: No further intervention required.   Encounter Outcome:  Patient Visit Completed   Gean Maidens BSN RN [facility] [phone]

## 2022-11-23 DIAGNOSIS — F4310 Post-traumatic stress disorder, unspecified: Secondary | ICD-10-CM | POA: Diagnosis not present

## 2022-11-25 ENCOUNTER — Encounter: Payer: Self-pay | Admitting: Psychiatry

## 2022-11-26 ENCOUNTER — Other Ambulatory Visit: Payer: Self-pay | Admitting: Psychiatry

## 2022-11-26 DIAGNOSIS — F411 Generalized anxiety disorder: Secondary | ICD-10-CM | POA: Diagnosis not present

## 2022-11-26 DIAGNOSIS — F321 Major depressive disorder, single episode, moderate: Secondary | ICD-10-CM | POA: Diagnosis not present

## 2022-11-26 DIAGNOSIS — F4311 Post-traumatic stress disorder, acute: Secondary | ICD-10-CM | POA: Diagnosis not present

## 2022-11-26 DIAGNOSIS — R229 Localized swelling, mass and lump, unspecified: Secondary | ICD-10-CM

## 2022-11-26 DIAGNOSIS — G4701 Insomnia due to medical condition: Secondary | ICD-10-CM | POA: Diagnosis not present

## 2022-11-29 DIAGNOSIS — C519 Malignant neoplasm of vulva, unspecified: Secondary | ICD-10-CM | POA: Diagnosis not present

## 2022-11-30 ENCOUNTER — Ambulatory Visit
Admission: RE | Admit: 2022-11-30 | Discharge: 2022-11-30 | Disposition: A | Discharge status: Home / Self Care | Payer: BC Managed Care – PPO | Source: Ambulatory Visit | Attending: Psychiatry | Admitting: Psychiatry

## 2022-11-30 ENCOUNTER — Other Ambulatory Visit: Payer: Self-pay | Admitting: Psychiatry

## 2022-11-30 ENCOUNTER — Telehealth: Payer: Self-pay

## 2022-11-30 DIAGNOSIS — R229 Localized swelling, mass and lump, unspecified: Secondary | ICD-10-CM

## 2022-11-30 DIAGNOSIS — F4310 Post-traumatic stress disorder, unspecified: Secondary | ICD-10-CM | POA: Diagnosis not present

## 2022-11-30 DIAGNOSIS — R1909 Other intra-abdominal and pelvic swelling, mass and lump: Secondary | ICD-10-CM | POA: Diagnosis not present

## 2022-11-30 NOTE — Telephone Encounter (Signed)
PT called to say she sent a Mychart message to Dr.Newton this morning stating she was having more pain and it is more swollen. She moved her ultrasound up to this morning at [facility] and will also send another picture through Mychart so images can be compared with previous pictures.   Pt aware I will send this message along with the Mychart message to Dr.Newton for advice. I will also call to get the ultrasound read.

## 2022-12-03 ENCOUNTER — Inpatient Hospital Stay: Payer: BC Managed Care – PPO

## 2022-12-03 ENCOUNTER — Inpatient Hospital Stay: Payer: BC Managed Care – PPO | Attending: Psychiatry | Admitting: Psychiatry

## 2022-12-03 ENCOUNTER — Ambulatory Visit: Payer: BC Managed Care – PPO

## 2022-12-03 VITALS — BP 118/76 | HR 108 | Temp 98.3°F | Resp 20 | Wt 150.4 lb

## 2022-12-03 DIAGNOSIS — R102 Pelvic and perineal pain: Secondary | ICD-10-CM | POA: Insufficient documentation

## 2022-12-03 DIAGNOSIS — N764 Abscess of vulva: Secondary | ICD-10-CM | POA: Diagnosis not present

## 2022-12-03 DIAGNOSIS — Z9079 Acquired absence of other genital organ(s): Secondary | ICD-10-CM | POA: Diagnosis not present

## 2022-12-03 DIAGNOSIS — L0291 Cutaneous abscess, unspecified: Secondary | ICD-10-CM

## 2022-12-03 DIAGNOSIS — C519 Malignant neoplasm of vulva, unspecified: Secondary | ICD-10-CM | POA: Insufficient documentation

## 2022-12-03 MED ORDER — SULFAMETHOXAZOLE-TRIMETHOPRIM 800-160 MG PO TABS: 1.0000 | ORAL_TABLET | Freq: Two times a day (BID) | ORAL | 0 refills | Status: AC

## 2022-12-03 MED ORDER — OXYCODONE HCL 5 MG PO TABS: 5.0000 mg | ORAL_TABLET | ORAL | 0 refills | Status: AC | PRN

## 2022-12-03 MED ORDER — METRONIDAZOLE 500 MG PO TABS: 500.0000 mg | ORAL_TABLET | Freq: Two times a day (BID) | ORAL | 0 refills | Status: AC

## 2022-12-03 NOTE — Progress Notes (Unsigned)
Gynecologic Oncology Return Clinic Visit  Date of Service: 12/03/2022 Referring Provider: [name], MD   Assessment & Plan: Misty Lowe is a 47 y.o. 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  Postop: - Pt recovering well from surgery and healing appropriately postoperatively - Intraoperative findings and pathology results reviewed. - Ongoing postoperative expectations and precautions reviewed.  -Given some erythema and swelling at superior aspect of vulvar incision, will prescribe short course of antibiotics to cover for cellulitis.  Patient with penicillin allergy.  Bactrim x 5 days sent to pharmacy.  ***considering trial I&D Bactrim flagyl Return in 1 week Oxycodone Topical lidocaine  Vulvar cancer: -Reviewed the nature of vulvar cancer. - Reviewed patient's pathology results and intraoperative findings in detail. - Reviewed standard of care to include completion lymph node dissection followed by radiation +/- cisplatin. - Additionally reviewed option for clinical trial at [facility] which is evaluating cisRT without completion lymph node dissection.  Based on my review of inclusion/exclusion criteria I think that she may be a candidate. - Additionally, CPS score ordered on pathology.  If positive, could consider addition of pembrolizumab to standard of care radiation treatment as above following lymph node dissection based on data from cervical cancer.  Reviewed limitations of this extrapolation of data. - Pros/cons of each reviewed - Pt may be interested in study at [facility]. Will send her records to see if she qualifies.  - Will follow-up CPS score.  RTC pending final treatment plan (after evaluation at [facility]).  Misty Cliff, MD Gynecologic Oncology   Medical Decision  Making I personally spent  TOTAL 45 minutes face-to-face and non-face-to-face in the care of this patient, which includes all pre, intra, and post visit time on the date of service. The discussion of diagnosis and treatment of vulvar cancer is beyond the scope of routine postoperative care.   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    09/19/2022 Initial Diagnosis   Vulva cancer (HCC)   09/24/2022 Pathology Results   Vulvar biopsy: A. RIGHT LABIA MINORA, BIOPSY:  Mild squamous dysplasia with parakeratosis (LSIL, VIN 1)   B. LEFT LABIA MINORA, BIOPSY:  Invasive moderately differentiated squamous cell carcinoma (see comment)   COMMENT:  The biopsy is maloriented and shows an invasive squamous carcinoma with  a thickness of 1.3 mm. There is no associated overlying epithelium.  The  biopsy shows only rare detached/denuded fragments of squamous  epithelium.    11/01/2022 Surgery   radical partial vulvectomy including clitorectomy, left inguinofemoral sentinel lymph node evaluation and biopsy    11/01/2022 Cancer Staging   Staging form: Vulva, AJCC V9 - Pathologic stage from 11/01/2022: FIGO Stage IIIC (pT1b, pN1c(sn), cM0) - Signed by Misty Cliff, MD on 11/20/2022 Histopathologic type: Squamous cell carcinoma, NOS Stage prefix: Initial diagnosis Method of lymph node assessment: Sentinel lymph node biopsy Histologic grade (G): G2 Histologic grading system: 3 grade system   11/01/2022 Pathology Results      A: Lymph node, left groin, sentinel lymph node excision: - Positive for metastatic carcinoma. - Positive for extranodal extension.    B: Vulva, left  anterior,  left radical vulvectomy:  - Invasive squamous cell carcinoma, well to moderately differentiated. 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  - All margins negative for carcinoma or dVIN.    C: Vulva, clitoral margin, biopsy: - Benign skin, negative for carcinoma or dVIN.     Comment: S100 performed on Block B6 highlights the nerve tissue. This is supportive of the presence of clitoral tissue.   P53 is performed on blocks B4 and B5, and demonstrates null pattern (mutant) expression in the areas of dVIN and carcinoma. Ki67 also performed on those blocks indicate increased proliferation in dVIN. P16 is patchy positive. The immunophenotype supports the above diagnosis and argue against an HPV driven process.    In part C, p53 demonstrates wild type expression and Ki67 demonstrates low proliferation, which argues against the presence of dVIN in this biopsy.        This electronic signature is attestation that the pathologist personally reviewed the submitted material(s) and the final diagnosis reflects that evaluation.  Electronically signed by Misty Darby, MD on 11/12/2022 at  6:26 PM  VULVA VULVA - All Specimens 9th Edition - Protocol posted: 07/18/2022  SPECIMEN    Procedure:    Radical vulvectomy  TUMOR    Tumor Focality:    Unifocal    Tumor Site:    Left vulva      :    Labium minus    Tumor Site:    Clitoris    Tumor Size:    Greatest Dimension (Centimeters): 2.1 cm    Histologic Type:    Squamous cell carcinoma, HPV-independent    Histologic Grade:    G2, moderately differentiated    Depth of Invasion (Millimeters) (FIGO 2021 method):    7 mm    Depth of Invasion (Millimeters) (conventional method):    7 mm    Other Tissue / Organ Involvement:    Not applicable    Lymphatic and / or Vascular Invasion:    Not identified  MARGINS    Margin Status for Invasive Carcinoma:    All margins negative for invasive carcinoma       Closest Margin(s) to Invasive Carcinoma:    Peripheral: 9:00 lateral margin      Closest Margin(s) to Invasive Carcinoma:    Deep      Distance from Invasive Carcinoma to Closest Margin:    7 mm    Margin Status for Precursor Lesions of Squamous Cell Carcinoma:    All margins negative for squamous precursor lesions  REGIONAL LYMPH NODES    Regional Lymph Node Status:          :    Tumor present in regional lymph node(s)        Number of Nodes with Metastasis Greater than 5 mm:    1        Number of Nodes with Metastasis 5 mm or Less but Greater than 2 mm:    0        Number of Nodes with Micrometastasis:    0        Size of Largest Nodal Metastatic Deposit:    22 mm        Nodal Site(s) with Tumor:    Left groin sentinel lymph node        Additional Lymph Node Findings:    Extranodal extension / extracapsular spread      Total Number of Lymph Nodes Examined:    1  Number of Sentinel Nodes Examined:    1  pTNM CLASSIFICATION (AJCC Version 9)    Reporting of pT, pN, and (when applicable) pM categories is based on information available to the pathologist at the time the report is issued. As per the AJCC (Chapter 1, 8th Ed.) it is the managing physician's responsibility to establish the final pathologic stage based upon all pertinent information, including but potentially not limited to this pathology report.    pT Category:    pT1b    pN Category:    pN1c    N Suffix:    (sn)  FIGO STAGE    FIGO Stage:    IIIC  ADDITIONAL FINDINGS    Additional Findings:    Differentiated vulvar intraepithelial neoplasia (dVIN)  SPECIAL STUDIES    p16 Immunohistochemistry:    Negative    p53 Immunohistochemistry:    Abnormal      :    Absent / null       Interval History: ***  Past Medical/Surgical History: Past Medical History:  Diagnosis Date   Endometriosis    Hearing impairment    Hiatal hernia    PONV (postoperative nausea and vomiting)     Past Surgical History:  Procedure  Laterality Date   CESAREAN SECTION     CESAREAN SECTION  08/28/2011   Procedure: CESAREAN SECTION;  Surgeon: Philip Aspen, DO;  Location: [facility];  Service: Gynecology;  Laterality: N/A;   DIAGNOSTIC LAPAROSCOPY  2007   for pain, per pt diagnosed with endometriosis   DILATION AND CURETTAGE OF UTERUS     for SAB   EAR MASTOIDECTOMY W/ COCHLEAR IMPLANT W/ LANDMARK      Family History  Problem Relation Age of Onset   Other Neg Hx     Social History   Socioeconomic History   Marital status: Legally Separated    Spouse name: Not on file   Number of children: Not on file   Years of education: Not on file   Highest education level: Not on file  Occupational History   Not on file  Tobacco Use   Smoking status: Never   Smokeless tobacco: Not on file  Substance and Sexual Activity   Alcohol use: No   Drug use: No   Sexual activity: Yes  Other Topics Concern   Not on file  Social History Narrative   Not on file   Social Determinants of Health   Financial Resource Strain: Low Risk  (11/02/2022)   Received from [facility]   Overall Financial Resource Strain (CARDIA)    Difficulty of Paying Living Expenses: Not very hard  Food Insecurity: No Food Insecurity (11/02/2022)   Received from [facility]   Hunger Vital Sign    Worried About Running Out of Food in the Last Year: Never true    Ran Out of Food in the Last Year: Never true  Transportation Needs: No Transportation Needs (11/02/2022)   Received from [facility] - Transportation    Lack of Transportation (Medical): No    Lack of Transportation (Non-Medical): No  Physical Activity: Not on file  Stress: Stress Concern Present (11/07/2022)   [employer] of Occupational Health - Occupational Stress Questionnaire    Feeling of Stress : Very much  Social Connections: Not on file    Current Medications:  Current Outpatient Medications:    acetaminophen (TYLENOL) 500 MG tablet, Take 1,000 mg by  mouth every 8 (eight) hours as  needed for mild pain. X 7 days, Disp: , Rfl:    albuterol (VENTOLIN HFA) 108 (90 Base) MCG/ACT inhaler, Inhale 1-2 puffs into the lungs every 6 (six) hours as needed for wheezing., Disp: , Rfl:    BLISOVI FE 1/20 1-20 MG-MCG tablet, Take 1 tablet by mouth daily., Disp: , Rfl:    HYDROcodone-acetaminophen (NORCO/VICODIN) 5-325 MG tablet, Take 1 tablet by mouth every 6 (six) hours as needed for moderate pain or severe pain. Do not take and drive., Disp: 20 tablet, Rfl: 0   ibuprofen (ADVIL) 200 MG tablet, Take 600 mg by mouth every 6 (six) hours as needed for mild pain., Disp: , Rfl:    omeprazole (PRILOSEC) 40 MG capsule, Take 1 capsule (40 mg total) by mouth daily., Disp: 90 capsule, Rfl: 3   oxyCODONE (OXY IR/ROXICODONE) 5 MG immediate release tablet, Take 5 mg by mouth every 4 (four) hours as needed for severe pain. (Patient not taking: Reported on 11/14/2022), Disp: , Rfl:    Sennosides (SENNA) 8.6 MG CAPS, Take 1 capsule by mouth daily., Disp: , Rfl:    venlafaxine XR (EFFEXOR-XR) 150 MG 24 hr capsule, Take 150 mg by mouth every morning., Disp: , Rfl:   Review of Symptoms: Complete 10-system review is positive for: Pelvic pain, headache Physical Exam: BP 118/76 (BP Location: Right Arm, Patient Position: Sitting)   Pulse (!) 108   Temp 98.3 F (36.8 C) (Oral)   Resp 20   Wt 150 lb 6.4 oz (68.2 kg)   SpO2 99%   BMI 28.42 kg/m  General: Alert, oriented, no acute distress.   Groin: Well-healed left groin incision GU: erythema on left mons with 2x2cm area of round firm induration.  INCISION AND DRAINAGE: Misty Lowe is a 47 y.o. woman who presents today for a incision and drainage, see details of the visit above.  The procedure was explained to the patient and verbal consent was obtained prior to the procedure.  The skin was cleaned with Betadine.  3 ml of 2% lidocaine was injected at the planned I&D site.  An 11 blade was used to make an incision  overlying the indurated area.  Initially some small purulent drainage was produced.  A culture swab was obtained.  The area was then further bluntly opened with a hemostat.  The opening was then packed with 1/2 inch packing tape and covered with a 4 x 4 and tape.  Hemostasis was achieved with silver nitrate.  Patient tolerated the procedure well.    Laboratory & Radiologic Studies:  US PELVIS LIMITED (TRANSABDOMINAL ONLY) 11/30/2022  Narrative CLINICAL DATA:  Swelling of left mons pubis status post surgical resection.  EXAM: LIMITED ULTRASOUND OF PELVIS  TECHNIQUE: Limited transabdominal ultrasound examination of the pelvis was performed.  COMPARISON:  Ultrasound of February 10, 2013. PET scan of October 05, 2022.  FINDINGS: Limited sonographic evaluation was performed in the area of clinical concern in the left mons pubis region. Heterogeneous rounded abnormality measuring 1.9 x 1.6 x 1.3 cm is noted in this area with some vascularity noted on color Doppler. It appears to be predominantly solid, with possible complex hypoechoic center.  IMPRESSION: 1.9 x 1.6 x 1.3 cm heterogeneous rounded abnormality seen in the area of clinical concern in the left mons pubis region. It does demonstrate some vascularity on color Doppler. It is uncertain if this represents neoplasm or possible complex abscess.   Electronically Signed By: Lupita Raider M.D. On: 11/30/2022 13:44

## 2022-12-03 NOTE — Patient Instructions (Signed)
Today, Dr. Alvester Morin opened the reddened, painful area on your mons. We took a culture swab of the area.   Dr. Alvester Morin would like for you to start taking the antibiotics bactrim and flagyl now. Avoid alcohol when taking flagyl.   She has sent in oxycodone you can take prior to having the dressing change and you can use topical lidocaine 15 minutes before as well on the area.   Plan on having the packing and dressing changed at least once a day and or if it becomes soiled. You can use the saline syringes to moisten the packing strip if it seems dry or you have tenderness with removal of the strip.  Plan on placing the strip into the opening. It does not take a large amount. Cut a tail of the strip long enough so you are able to pull this out with the next dressing change.   We will plan on seeing you in the office in one week or sooner if needed. Please call for any needs or concerns at [phone].

## 2022-12-04 ENCOUNTER — Telehealth: Payer: Self-pay

## 2022-12-04 ENCOUNTER — Encounter: Payer: Self-pay | Admitting: Psychiatry

## 2022-12-04 DIAGNOSIS — Z880 Allergy status to penicillin: Secondary | ICD-10-CM | POA: Diagnosis not present

## 2022-12-04 DIAGNOSIS — N762 Acute vulvitis: Secondary | ICD-10-CM | POA: Diagnosis not present

## 2022-12-04 DIAGNOSIS — N764 Abscess of vulva: Secondary | ICD-10-CM | POA: Diagnosis not present

## 2022-12-04 DIAGNOSIS — Z79899 Other long term (current) drug therapy: Secondary | ICD-10-CM | POA: Diagnosis not present

## 2022-12-04 DIAGNOSIS — B957 Other staphylococcus as the cause of diseases classified elsewhere: Secondary | ICD-10-CM | POA: Diagnosis not present

## 2022-12-04 DIAGNOSIS — C774 Secondary and unspecified malignant neoplasm of inguinal and lower limb lymph nodes: Secondary | ICD-10-CM | POA: Diagnosis not present

## 2022-12-04 DIAGNOSIS — Z881 Allergy status to other antibiotic agents status: Secondary | ICD-10-CM | POA: Diagnosis not present

## 2022-12-04 DIAGNOSIS — C519 Malignant neoplasm of vulva, unspecified: Secondary | ICD-10-CM | POA: Diagnosis not present

## 2022-12-04 DIAGNOSIS — L03818 Cellulitis of other sites: Secondary | ICD-10-CM | POA: Diagnosis not present

## 2022-12-04 DIAGNOSIS — G2581 Restless legs syndrome: Secondary | ICD-10-CM | POA: Diagnosis not present

## 2022-12-04 NOTE — Telephone Encounter (Signed)
I reached out to Ms.Trulson to schedule a follow up appointment with Warner Mccreedy NP for Friday 11/8.  Pt states she is currently being admitted to [facility] for IV abx. She was seen by her research MD this morning and they removed the packing and states it looks like cellulitis.   Pt is aware I will notify Dr. Alvester Morin and Warner Mccreedy NP. She states she will call with an update later this week.

## 2022-12-06 LAB — AEROBIC CULTURE W GRAM STAIN (SUPERFICIAL SPECIMEN): Gram Stain: NONE SEEN

## 2022-12-10 ENCOUNTER — Inpatient Hospital Stay: Payer: BC Managed Care – PPO | Admitting: Psychiatry

## 2022-12-10 DIAGNOSIS — C519 Malignant neoplasm of vulva, unspecified: Secondary | ICD-10-CM

## 2022-12-10 NOTE — Progress Notes (Deleted)
Gynecologic Oncology Return Clinic Visit  Date of Service: 12/10/2022 Referring Provider: [name], MD   Assessment & Plan: Misty Lowe is a 47 y.o. 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  Abscess: - Ultrasound performed on 11/30/2022 noted a 1.9 cm heterogeneous rounded abnormality in the area of the left mons. - I&D performed today with small purulent drainage from this area.  Suspect superinfected hematoma. - Small piece of Xeroform placed in I&D for working. - Will send on additional antibiotics of Bactrim and Flagyl for 7 days. - Plan 1 week follow-up.  - Additional prescription for oxycodone and topical lidocaine placed to aid in pain control while performing changes of iodoform wick.  Vulvar cancer: -Patient was seen at [facility] for consideration of clinical trial.  Patient is interested in this potential option. - Patient reports that she meets with oncology to discuss the chemotherapy component to aid in her decision making given her baseline hearing loss. - We will follow-up with patient to see if she desires to proceed with trial as her management. - Will follow-up CPS score (in process at [facility]).  RTC 1 week  Addendum: Given that patient would need to be off antibiotics for 2 weeks for clinical trial, plan to patient return to clinic on Friday to be seen by NP to see if improving and able to stop antibiotics sooner safely to aid in clinical trial enrollment.  [name], MD Gynecologic Oncology   ----------------------- Reason for Visit: Postop follow-up, possible abscess  Treatment History: Oncology History  Vulva cancer (HCC)  09/07/2021 Initial Biopsy   Initial evaluation of biopsy  (09/07/21): non diagnostic Re-evaluation of biopsy 09/19/22: Left labia vulvar biopsy: Moderately to poorly differentiated squamous cell carcinoma, a acantholytic (adenoid) variant.    09/19/2022 Initial Diagnosis   Vulva cancer (HCC)   09/24/2022 Pathology Results   Vulvar biopsy: A. RIGHT LABIA MINORA, BIOPSY:  Mild squamous dysplasia with parakeratosis (LSIL, VIN 1)   B. LEFT LABIA MINORA, BIOPSY:  Invasive moderately differentiated squamous cell carcinoma (see comment)   COMMENT:  The biopsy is maloriented and shows an invasive squamous carcinoma with  a thickness of 1.3 mm. There is no associated overlying epithelium.  The  biopsy shows only rare detached/denuded fragments of squamous  epithelium.    11/01/2022 Surgery   radical partial vulvectomy including clitorectomy, left inguinofemoral sentinel lymph node evaluation and biopsy    11/01/2022 Cancer Staging   Staging form: Vulva, AJCC V9 - Pathologic stage from 11/01/2022: FIGO Stage IIIC (pT1b, pN1c(sn), cM0) - Signed by [name], MD on 11/20/2022 Histopathologic type: Squamous cell carcinoma, NOS Stage prefix: Initial diagnosis Method of lymph node assessment: Sentinel lymph node biopsy Histologic grade (G): G2 Histologic grading system: 3 grade system   11/01/2022 Pathology Results      A: Lymph node, left groin, sentinel lymph node excision: - Positive for metastatic carcinoma. - Positive for extranodal extension.    B: Vulva, left anterior, left radical vulvectomy:  - Invasive squamous cell carcinoma, well to moderately differentiated. 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  - All margins negative for carcinoma or dVIN.    C: Vulva, clitoral margin, biopsy: - Benign skin, negative for carcinoma or dVIN.     Comment: S100 performed on  Block B6 highlights the nerve tissue. This is supportive of the presence of clitoral tissue.   P53 is performed on blocks B4 and B5,  and demonstrates null pattern (mutant) expression in the areas of dVIN and carcinoma. Ki67 also performed on those blocks indicate increased proliferation in dVIN. P16 is patchy positive. The immunophenotype supports the above diagnosis and argue against an HPV driven process.    In part C, p53 demonstrates wild type expression and Ki67 demonstrates low proliferation, which argues against the presence of dVIN in this biopsy.        This electronic signature is attestation that the pathologist personally reviewed the submitted material(s) and the final diagnosis reflects that evaluation.  Electronically signed by [name], MD on 11/12/2022 at  6:26 PM  VULVA VULVA - All Specimens 9th Edition - Protocol posted: 07/18/2022  SPECIMEN    Procedure:    Radical vulvectomy  TUMOR    Tumor Focality:    Unifocal    Tumor Site:    Left vulva      :    Labium minus    Tumor Site:    Clitoris    Tumor Size:    Greatest Dimension (Centimeters): 2.1 cm    Histologic Type:    Squamous cell carcinoma, HPV-independent    Histologic Grade:    G2, moderately differentiated    Depth of Invasion (Millimeters) (FIGO 2021 method):    7 mm    Depth of Invasion (Millimeters) (conventional method):    7 mm    Other Tissue / Organ Involvement:    Not applicable    Lymphatic and / or Vascular Invasion:    Not identified  MARGINS    Margin Status for Invasive Carcinoma:    All margins negative for invasive carcinoma      Closest Margin(s) to Invasive Carcinoma:    Peripheral: 9:00 lateral margin      Closest Margin(s) to Invasive Carcinoma:    Deep      Distance from Invasive Carcinoma to Closest Margin:    7 mm    Margin Status for Precursor Lesions of Squamous Cell Carcinoma:    All margins negative for squamous precursor lesions  REGIONAL LYMPH NODES    Regional Lymph Node Status:          :    Tumor present in regional lymph node(s)        Number of Nodes with Metastasis Greater than 5 mm:     1        Number of Nodes with Metastasis 5 mm or Less but Greater than 2 mm:    0        Number of Nodes with Micrometastasis:    0        Size of Largest Nodal Metastatic Deposit:    22 mm        Nodal Site(s) with Tumor:    Left groin sentinel lymph node        Additional Lymph Node Findings:    Extranodal extension / extracapsular spread      Total Number of Lymph Nodes Examined:    1      Number of Sentinel Nodes Examined:    1  pTNM CLASSIFICATION (AJCC Version 9)    Reporting of pT, pN, and (when applicable) pM categories is based on information available to the pathologist at the time the report is issued. As per the AJCC (Chapter 1, 8th Ed.) it is the  managing physician's responsibility to establish the final pathologic stage based upon all pertinent information, including but potentially not limited to this pathology report.    pT Category:    pT1b    pN Category:    pN1c    N Suffix:    (sn)  FIGO STAGE    FIGO Stage:    IIIC  ADDITIONAL FINDINGS    Additional Findings:    Differentiated vulvar intraepithelial neoplasia (dVIN)  SPECIAL STUDIES    p16 Immunohistochemistry:    Negative    p53 Immunohistochemistry:    Abnormal      :    Absent / null       Interval History: Patient reports worsening pain, redness, swelling at the left aspect of her mons.  Reports nodule at this area.  Past Medical/Surgical History: Past Medical History:  Diagnosis Date   Endometriosis    Hearing impairment    Hiatal hernia    PONV (postoperative nausea and vomiting)     Past Surgical History:  Procedure Laterality Date   CESAREAN SECTION     CESAREAN SECTION  08/28/2011   Procedure: CESAREAN SECTION;  Surgeon: Philip Aspen, DO;  Location: [facility];  Service: Gynecology;  Laterality: N/A;   DIAGNOSTIC LAPAROSCOPY  2007   for pain, per pt diagnosed with endometriosis   DILATION AND CURETTAGE OF UTERUS     for SAB   EAR MASTOIDECTOMY W/ COCHLEAR IMPLANT W/ LANDMARK       Family History  Problem Relation Age of Onset   Other Neg Hx     Social History   Socioeconomic History   Marital status: Legally Separated    Spouse name: Not on file   Number of children: Not on file   Years of education: Not on file   Highest education level: Not on file  Occupational History   Not on file  Tobacco Use   Smoking status: Never   Smokeless tobacco: Not on file  Substance and Sexual Activity   Alcohol use: No   Drug use: No   Sexual activity: Yes  Other Topics Concern   Not on file  Social History Narrative   Not on file   Social Determinants of Health   Financial Resource Strain: Low Risk  (12/07/2022)   Received from [facility]   Overall Financial Resource Strain (CARDIA)    Difficulty of Paying Living Expenses: Not hard at all  Food Insecurity: No Food Insecurity (12/07/2022)   [facility]   Hunger Vital Sign    Worried About Running Out of Food in the Last Year: Never true    Ran Out of Food in the Last Year: Never true  Transportation Needs: No Transportation Needs (12/07/2022)   Received from [facility] - Transportation    In the past 12 months, has lack of transportation kept you from medical appointments or from getting medications?: No    Lack of Transportation (Non-Medical): No  Physical Activity: Not on file  Stress: Stress Concern Present (11/07/2022)   [employer] [facility] [employer] [facility] - Occupational Stress Questionnaire    Feeling of Stress : Very much  Social Connections: Not on file    Current Medications:  Current Outpatient Medications:    acetaminophen (TYLENOL) 500 MG tablet, Take 1,000 mg by mouth every 8 (eight) hours as needed for mild pain. X 7 days, Disp: , Rfl:    albuterol (VENTOLIN HFA) 108 (90  Base) MCG/ACT inhaler, Inhale 1-2 puffs into the lungs every 6 (six) hours as needed for wheezing., Disp: , Rfl:    BLISOVI FE 1/20 1-20  MG-MCG tablet, Take 1 tablet by mouth daily., Disp: , Rfl:    HYDROcodone-acetaminophen (NORCO/VICODIN) 5-325 MG tablet, Take 1 tablet by mouth every 6 (six) hours as needed for moderate pain or severe pain. Do not take and drive., Disp: 20 tablet, Rfl: 0   ibuprofen (ADVIL) 200 MG tablet, Take 600 mg by mouth every 6 (six) hours as needed for mild pain., Disp: , Rfl:    metroNIDAZOLE (FLAGYL) 500 MG tablet, Take 1 tablet (500 mg total) by mouth 2 (two) times daily for 7 days., Disp: 14 tablet, Rfl: 0   omeprazole (PRILOSEC) 40 MG capsule, Take 1 capsule (40 mg total) by mouth daily., Disp: 90 capsule, Rfl: 3   oxyCODONE (OXY IR/ROXICODONE) 5 MG immediate release tablet, Take 5 mg by mouth every 4 (four) hours as needed for severe pain. (Patient not taking: Reported on 11/14/2022), Disp: , Rfl:    oxyCODONE (OXY IR/ROXICODONE) 5 MG immediate release tablet, Take 1 tablet (5 mg total) by mouth every 4 (four) hours as needed for up to 7 days for severe pain (pain score 7-10) (dressing change)., Disp: 20 tablet, Rfl: 0   Sennosides (SENNA) 8.6 MG CAPS, Take 1 capsule by mouth daily., Disp: , Rfl:    sulfamethoxazole-trimethoprim (BACTRIM DS) 800-160 MG tablet, Take 1 tablet by mouth 2 (two) times daily for 7 days., Disp: 14 tablet, Rfl: 0   venlafaxine XR (EFFEXOR-XR) 150 MG 24 hr capsule, Take 150 mg by mouth every morning., Disp: , Rfl:   Review of Symptoms: Complete 10-system review is positive for: Pelvic pain, headache Physical Exam: There were no vitals taken for this visit. General: Alert, oriented, no acute distress.   Groin: Well-healed left groin incision GU: erythema on left mons with 2x2cm area of round firm induration.  INCISION AND DRAINAGE: Misty Lowe is a 47 y.o. woman who presents today for a incision and drainage, see details of the visit above.  The procedure was explained to the patient and verbal consent was obtained prior to the procedure.  The skin was cleaned with  Betadine.  3 ml of 2% lidocaine was injected at the planned I&D site.  An 11 blade was used to make an incision overlying the indurated area.  Initially some small purulent drainage was produced.  A culture swab was obtained.  The area was then further bluntly opened with a hemostat.  The opening was then packed with 1/2 inch packing tape and covered with a 4 x 4 and tape.  Hemostasis was achieved with silver nitrate.  Patient tolerated the procedure well.    Laboratory & Radiologic Studies:  US PELVIS LIMITED (TRANSABDOMINAL ONLY) 11/30/2022  Narrative CLINICAL DATA:  Swelling of left mons pubis status post surgical resection.  EXAM: LIMITED ULTRASOUND OF PELVIS  TECHNIQUE: Limited transabdominal ultrasound examination of the pelvis was performed.  COMPARISON:  Ultrasound of February 10, 2013. PET scan of October 05, 2022.  FINDINGS: Limited sonographic evaluation was performed in the area of clinical concern in the left mons pubis region. Heterogeneous rounded abnormality measuring 1.9 x 1.6 x 1.3 cm is noted in this area with some vascularity noted on color Doppler. It appears to be predominantly solid, with possible complex hypoechoic center.  IMPRESSION: 1.9 x 1.6 x 1.3 cm heterogeneous rounded abnormality seen in the area of clinical concern in  the left mons pubis region. It does demonstrate some vascularity on color Doppler. It is uncertain if this represents neoplasm or possible complex abscess.   Electronically Signed By: Lupita Raider M.D. On: 11/30/2022 13:44

## 2022-12-11 DIAGNOSIS — N764 Abscess of vulva: Secondary | ICD-10-CM | POA: Diagnosis not present

## 2022-12-11 DIAGNOSIS — D071 Carcinoma in situ of vulva: Secondary | ICD-10-CM | POA: Diagnosis not present

## 2022-12-11 DIAGNOSIS — C519 Malignant neoplasm of vulva, unspecified: Secondary | ICD-10-CM | POA: Diagnosis not present

## 2022-12-11 DIAGNOSIS — Z880 Allergy status to penicillin: Secondary | ICD-10-CM | POA: Diagnosis not present

## 2022-12-11 DIAGNOSIS — Z79899 Other long term (current) drug therapy: Secondary | ICD-10-CM | POA: Diagnosis not present

## 2022-12-11 DIAGNOSIS — Y838 Other surgical procedures as the cause of abnormal reaction of the patient, or of later complication, without mention of misadventure at the time of the procedure: Secondary | ICD-10-CM | POA: Diagnosis not present

## 2022-12-11 DIAGNOSIS — Z9079 Acquired absence of other genital organ(s): Secondary | ICD-10-CM | POA: Diagnosis not present

## 2022-12-11 DIAGNOSIS — T148XXA Other injury of unspecified body region, initial encounter: Secondary | ICD-10-CM | POA: Diagnosis not present

## 2022-12-11 DIAGNOSIS — L089 Local infection of the skin and subcutaneous tissue, unspecified: Secondary | ICD-10-CM | POA: Diagnosis not present

## 2022-12-11 DIAGNOSIS — Z9889 Other specified postprocedural states: Secondary | ICD-10-CM | POA: Diagnosis not present

## 2022-12-11 DIAGNOSIS — H90A21 Sensorineural hearing loss, unilateral, right ear, with restricted hearing on the contralateral side: Secondary | ICD-10-CM | POA: Diagnosis not present

## 2022-12-11 DIAGNOSIS — T8141XA Infection following a procedure, superficial incisional surgical site, initial encounter: Secondary | ICD-10-CM | POA: Diagnosis not present

## 2022-12-11 DIAGNOSIS — Z87440 Personal history of urinary (tract) infections: Secondary | ICD-10-CM | POA: Diagnosis not present

## 2022-12-11 DIAGNOSIS — N762 Acute vulvitis: Secondary | ICD-10-CM | POA: Diagnosis not present

## 2022-12-11 DIAGNOSIS — F4310 Post-traumatic stress disorder, unspecified: Secondary | ICD-10-CM | POA: Diagnosis not present

## 2022-12-11 DIAGNOSIS — C779 Secondary and unspecified malignant neoplasm of lymph node, unspecified: Secondary | ICD-10-CM | POA: Diagnosis not present

## 2022-12-11 DIAGNOSIS — Z881 Allergy status to other antibiotic agents status: Secondary | ICD-10-CM | POA: Diagnosis not present

## 2022-12-13 ENCOUNTER — Telehealth: Payer: Self-pay | Admitting: *Deleted

## 2022-12-13 DIAGNOSIS — C519 Malignant neoplasm of vulva, unspecified: Secondary | ICD-10-CM | POA: Diagnosis not present

## 2022-12-13 DIAGNOSIS — Z9889 Other specified postprocedural states: Secondary | ICD-10-CM | POA: Diagnosis not present

## 2022-12-13 NOTE — Telephone Encounter (Signed)
Spoke with Misty Lowe who called the office to cancel her appointment with Dr. Alvester Morin on Monday, 12/17/22. Pt states she is having surgery today with [facility] at 1:15 to remove abscess and she will be inpatient for a few days on IV antibiotics. Advised patient office will reach out to reschedule her appointment with Dr. Alvester Morin.

## 2022-12-14 DIAGNOSIS — C519 Malignant neoplasm of vulva, unspecified: Secondary | ICD-10-CM | POA: Diagnosis not present

## 2022-12-15 DIAGNOSIS — N762 Acute vulvitis: Secondary | ICD-10-CM | POA: Diagnosis not present

## 2022-12-15 DIAGNOSIS — C519 Malignant neoplasm of vulva, unspecified: Secondary | ICD-10-CM | POA: Diagnosis not present

## 2022-12-16 DIAGNOSIS — N762 Acute vulvitis: Secondary | ICD-10-CM | POA: Diagnosis not present

## 2022-12-16 DIAGNOSIS — C519 Malignant neoplasm of vulva, unspecified: Secondary | ICD-10-CM | POA: Diagnosis not present

## 2022-12-16 DIAGNOSIS — T148XXA Other injury of unspecified body region, initial encounter: Secondary | ICD-10-CM | POA: Diagnosis not present

## 2022-12-16 DIAGNOSIS — L089 Local infection of the skin and subcutaneous tissue, unspecified: Secondary | ICD-10-CM | POA: Diagnosis not present

## 2022-12-17 ENCOUNTER — Ambulatory Visit (HOSPITAL_BASED_OUTPATIENT_CLINIC_OR_DEPARTMENT_OTHER): Payer: BC Managed Care – PPO | Admitting: Psychiatry

## 2022-12-17 DIAGNOSIS — C519 Malignant neoplasm of vulva, unspecified: Secondary | ICD-10-CM

## 2022-12-17 NOTE — Telephone Encounter (Signed)
Spoke with Misty Lowe  who states she was discharged home yesterday from [facility] and is on an oral antibiotic. She has a post op visit with oncologist tomorrow and doesn't want to reschedule with Dr. Alvester Morin as of yet. States she has other appointments for scans and they also found some soft tissue build up during her surgery and they sent it off for biopsy. Advised patient that her message would be relayed to provider and encouraged her to keep the office posted.

## 2022-12-18 NOTE — Progress Notes (Signed)
This encounter was created in error - please disregard.

## 2022-12-24 ENCOUNTER — Other Ambulatory Visit: Payer: Self-pay | Admitting: Psychiatry

## 2022-12-24 DIAGNOSIS — F4310 Post-traumatic stress disorder, unspecified: Secondary | ICD-10-CM | POA: Diagnosis not present

## 2022-12-25 DIAGNOSIS — C519 Malignant neoplasm of vulva, unspecified: Secondary | ICD-10-CM | POA: Diagnosis not present

## 2022-12-31 DIAGNOSIS — Z08 Encounter for follow-up examination after completed treatment for malignant neoplasm: Secondary | ICD-10-CM | POA: Diagnosis not present

## 2022-12-31 DIAGNOSIS — C519 Malignant neoplasm of vulva, unspecified: Secondary | ICD-10-CM | POA: Diagnosis not present

## 2022-12-31 DIAGNOSIS — C774 Secondary and unspecified malignant neoplasm of inguinal and lower limb lymph nodes: Secondary | ICD-10-CM | POA: Diagnosis not present

## 2022-12-31 DIAGNOSIS — C518 Malignant neoplasm of overlapping sites of vulva: Secondary | ICD-10-CM | POA: Diagnosis not present

## 2023-01-04 DIAGNOSIS — F4310 Post-traumatic stress disorder, unspecified: Secondary | ICD-10-CM | POA: Diagnosis not present

## 2023-01-07 DIAGNOSIS — C519 Malignant neoplasm of vulva, unspecified: Secondary | ICD-10-CM | POA: Diagnosis not present

## 2023-01-08 DIAGNOSIS — C519 Malignant neoplasm of vulva, unspecified: Secondary | ICD-10-CM | POA: Diagnosis not present

## 2023-01-08 DIAGNOSIS — Z7189 Other specified counseling: Secondary | ICD-10-CM | POA: Diagnosis not present

## 2023-01-08 DIAGNOSIS — Z5111 Encounter for antineoplastic chemotherapy: Secondary | ICD-10-CM | POA: Diagnosis not present

## 2023-01-09 DIAGNOSIS — F4310 Post-traumatic stress disorder, unspecified: Secondary | ICD-10-CM | POA: Diagnosis not present

## 2023-01-16 DIAGNOSIS — F4310 Post-traumatic stress disorder, unspecified: Secondary | ICD-10-CM | POA: Diagnosis not present

## 2023-01-16 DIAGNOSIS — C519 Malignant neoplasm of vulva, unspecified: Secondary | ICD-10-CM | POA: Diagnosis not present

## 2023-01-17 DIAGNOSIS — C519 Malignant neoplasm of vulva, unspecified: Secondary | ICD-10-CM | POA: Diagnosis not present

## 2023-01-18 DIAGNOSIS — Z5111 Encounter for antineoplastic chemotherapy: Secondary | ICD-10-CM | POA: Diagnosis not present

## 2023-01-18 DIAGNOSIS — C519 Malignant neoplasm of vulva, unspecified: Secondary | ICD-10-CM | POA: Diagnosis not present

## 2023-01-18 DIAGNOSIS — Z510 Encounter for antineoplastic radiation therapy: Secondary | ICD-10-CM | POA: Diagnosis not present

## 2023-01-21 DIAGNOSIS — C519 Malignant neoplasm of vulva, unspecified: Secondary | ICD-10-CM | POA: Diagnosis not present

## 2023-01-22 DIAGNOSIS — C519 Malignant neoplasm of vulva, unspecified: Secondary | ICD-10-CM | POA: Diagnosis not present

## 2023-01-24 DIAGNOSIS — R10813 Right lower quadrant abdominal tenderness: Secondary | ICD-10-CM | POA: Diagnosis not present

## 2023-01-24 DIAGNOSIS — C519 Malignant neoplasm of vulva, unspecified: Secondary | ICD-10-CM | POA: Diagnosis not present

## 2023-01-24 DIAGNOSIS — Z880 Allergy status to penicillin: Secondary | ICD-10-CM | POA: Diagnosis not present

## 2023-01-24 DIAGNOSIS — R112 Nausea with vomiting, unspecified: Secondary | ICD-10-CM | POA: Diagnosis not present

## 2023-01-24 DIAGNOSIS — Z91048 Other nonmedicinal substance allergy status: Secondary | ICD-10-CM | POA: Diagnosis not present

## 2023-01-24 DIAGNOSIS — R9431 Abnormal electrocardiogram [ECG] [EKG]: Secondary | ICD-10-CM | POA: Diagnosis not present

## 2023-01-24 DIAGNOSIS — R590 Localized enlarged lymph nodes: Secondary | ICD-10-CM | POA: Diagnosis not present

## 2023-01-24 DIAGNOSIS — R10814 Left lower quadrant abdominal tenderness: Secondary | ICD-10-CM | POA: Diagnosis not present

## 2023-01-24 DIAGNOSIS — Z881 Allergy status to other antibiotic agents status: Secondary | ICD-10-CM | POA: Diagnosis not present

## 2023-01-24 DIAGNOSIS — R000 Tachycardia, unspecified: Secondary | ICD-10-CM | POA: Diagnosis not present

## 2023-01-25 DIAGNOSIS — C519 Malignant neoplasm of vulva, unspecified: Secondary | ICD-10-CM | POA: Diagnosis not present

## 2023-01-25 DIAGNOSIS — R000 Tachycardia, unspecified: Secondary | ICD-10-CM | POA: Diagnosis not present

## 2023-01-28 DIAGNOSIS — C519 Malignant neoplasm of vulva, unspecified: Secondary | ICD-10-CM | POA: Diagnosis not present

## 2023-01-29 DIAGNOSIS — E876 Hypokalemia: Secondary | ICD-10-CM | POA: Diagnosis not present

## 2023-01-29 DIAGNOSIS — Z7189 Other specified counseling: Secondary | ICD-10-CM | POA: Diagnosis not present

## 2023-01-29 DIAGNOSIS — Z510 Encounter for antineoplastic radiation therapy: Secondary | ICD-10-CM | POA: Diagnosis not present

## 2023-01-29 DIAGNOSIS — Z5111 Encounter for antineoplastic chemotherapy: Secondary | ICD-10-CM | POA: Diagnosis not present

## 2023-01-29 DIAGNOSIS — Z9079 Acquired absence of other genital organ(s): Secondary | ICD-10-CM | POA: Diagnosis not present

## 2023-01-29 DIAGNOSIS — C519 Malignant neoplasm of vulva, unspecified: Secondary | ICD-10-CM | POA: Diagnosis not present

## 2023-01-29 DIAGNOSIS — F4310 Post-traumatic stress disorder, unspecified: Secondary | ICD-10-CM | POA: Diagnosis not present

## 2023-01-29 DIAGNOSIS — Z9889 Other specified postprocedural states: Secondary | ICD-10-CM | POA: Diagnosis not present

## 2023-01-29 DIAGNOSIS — H9190 Unspecified hearing loss, unspecified ear: Secondary | ICD-10-CM | POA: Diagnosis not present

## 2023-01-31 DIAGNOSIS — C519 Malignant neoplasm of vulva, unspecified: Secondary | ICD-10-CM | POA: Diagnosis not present

## 2023-02-01 DIAGNOSIS — F4310 Post-traumatic stress disorder, unspecified: Secondary | ICD-10-CM | POA: Diagnosis not present

## 2023-02-01 DIAGNOSIS — C519 Malignant neoplasm of vulva, unspecified: Secondary | ICD-10-CM | POA: Diagnosis not present

## 2023-02-04 DIAGNOSIS — C519 Malignant neoplasm of vulva, unspecified: Secondary | ICD-10-CM | POA: Diagnosis not present

## 2023-02-05 ENCOUNTER — Ambulatory Visit: Payer: No Typology Code available for payment source | Admitting: Nurse Practitioner

## 2023-02-05 DIAGNOSIS — Z515 Encounter for palliative care: Secondary | ICD-10-CM | POA: Diagnosis not present

## 2023-02-05 DIAGNOSIS — Z79891 Long term (current) use of opiate analgesic: Secondary | ICD-10-CM | POA: Diagnosis not present

## 2023-02-05 DIAGNOSIS — Z9889 Other specified postprocedural states: Secondary | ICD-10-CM | POA: Diagnosis not present

## 2023-02-05 DIAGNOSIS — H9190 Unspecified hearing loss, unspecified ear: Secondary | ICD-10-CM | POA: Diagnosis not present

## 2023-02-05 DIAGNOSIS — C519 Malignant neoplasm of vulva, unspecified: Secondary | ICD-10-CM | POA: Diagnosis not present

## 2023-02-05 DIAGNOSIS — M792 Neuralgia and neuritis, unspecified: Secondary | ICD-10-CM | POA: Diagnosis not present

## 2023-02-05 DIAGNOSIS — Z9079 Acquired absence of other genital organ(s): Secondary | ICD-10-CM | POA: Diagnosis not present

## 2023-02-05 DIAGNOSIS — R110 Nausea: Secondary | ICD-10-CM | POA: Diagnosis not present

## 2023-02-05 DIAGNOSIS — G893 Neoplasm related pain (acute) (chronic): Secondary | ICD-10-CM | POA: Diagnosis not present

## 2023-02-05 DIAGNOSIS — F4310 Post-traumatic stress disorder, unspecified: Secondary | ICD-10-CM | POA: Diagnosis not present

## 2023-02-05 DIAGNOSIS — Z510 Encounter for antineoplastic radiation therapy: Secondary | ICD-10-CM | POA: Diagnosis not present

## 2023-02-05 DIAGNOSIS — Z5111 Encounter for antineoplastic chemotherapy: Secondary | ICD-10-CM | POA: Diagnosis not present

## 2023-02-05 DIAGNOSIS — Z9049 Acquired absence of other specified parts of digestive tract: Secondary | ICD-10-CM | POA: Diagnosis not present

## 2023-02-06 DIAGNOSIS — C519 Malignant neoplasm of vulva, unspecified: Secondary | ICD-10-CM | POA: Diagnosis not present

## 2023-02-07 DIAGNOSIS — C519 Malignant neoplasm of vulva, unspecified: Secondary | ICD-10-CM | POA: Diagnosis not present

## 2023-02-08 DIAGNOSIS — C519 Malignant neoplasm of vulva, unspecified: Secondary | ICD-10-CM | POA: Diagnosis not present

## 2023-02-08 DIAGNOSIS — F4310 Post-traumatic stress disorder, unspecified: Secondary | ICD-10-CM | POA: Diagnosis not present

## 2023-02-11 DIAGNOSIS — C519 Malignant neoplasm of vulva, unspecified: Secondary | ICD-10-CM | POA: Diagnosis not present

## 2023-02-12 ENCOUNTER — Encounter: Payer: No Typology Code available for payment source | Admitting: Nurse Practitioner

## 2023-02-12 DIAGNOSIS — Z510 Encounter for antineoplastic radiation therapy: Secondary | ICD-10-CM | POA: Diagnosis not present

## 2023-02-12 DIAGNOSIS — Z5111 Encounter for antineoplastic chemotherapy: Secondary | ICD-10-CM | POA: Diagnosis not present

## 2023-02-12 DIAGNOSIS — C519 Malignant neoplasm of vulva, unspecified: Secondary | ICD-10-CM | POA: Diagnosis not present

## 2023-02-12 DIAGNOSIS — R110 Nausea: Secondary | ICD-10-CM | POA: Diagnosis not present

## 2023-02-12 DIAGNOSIS — T451X5A Adverse effect of antineoplastic and immunosuppressive drugs, initial encounter: Secondary | ICD-10-CM | POA: Diagnosis not present

## 2023-02-13 DIAGNOSIS — C519 Malignant neoplasm of vulva, unspecified: Secondary | ICD-10-CM | POA: Diagnosis not present

## 2023-02-13 DIAGNOSIS — G893 Neoplasm related pain (acute) (chronic): Secondary | ICD-10-CM | POA: Diagnosis not present

## 2023-02-14 DIAGNOSIS — C519 Malignant neoplasm of vulva, unspecified: Secondary | ICD-10-CM | POA: Diagnosis not present

## 2023-02-15 DIAGNOSIS — F4310 Post-traumatic stress disorder, unspecified: Secondary | ICD-10-CM | POA: Diagnosis not present

## 2023-02-15 DIAGNOSIS — C519 Malignant neoplasm of vulva, unspecified: Secondary | ICD-10-CM | POA: Diagnosis not present

## 2023-02-19 DIAGNOSIS — Z510 Encounter for antineoplastic radiation therapy: Secondary | ICD-10-CM | POA: Diagnosis not present

## 2023-02-19 DIAGNOSIS — Z5111 Encounter for antineoplastic chemotherapy: Secondary | ICD-10-CM | POA: Diagnosis not present

## 2023-02-19 DIAGNOSIS — C519 Malignant neoplasm of vulva, unspecified: Secondary | ICD-10-CM | POA: Diagnosis not present

## 2023-02-19 DIAGNOSIS — Z79899 Other long term (current) drug therapy: Secondary | ICD-10-CM | POA: Diagnosis not present

## 2023-02-20 DIAGNOSIS — C519 Malignant neoplasm of vulva, unspecified: Secondary | ICD-10-CM | POA: Diagnosis not present

## 2023-02-21 DIAGNOSIS — C519 Malignant neoplasm of vulva, unspecified: Secondary | ICD-10-CM | POA: Diagnosis not present

## 2023-02-22 DIAGNOSIS — F4310 Post-traumatic stress disorder, unspecified: Secondary | ICD-10-CM | POA: Diagnosis not present

## 2023-02-22 DIAGNOSIS — C519 Malignant neoplasm of vulva, unspecified: Secondary | ICD-10-CM | POA: Diagnosis not present

## 2023-02-25 DIAGNOSIS — F411 Generalized anxiety disorder: Secondary | ICD-10-CM | POA: Diagnosis not present

## 2023-02-25 DIAGNOSIS — R300 Dysuria: Secondary | ICD-10-CM | POA: Diagnosis not present

## 2023-02-25 DIAGNOSIS — F321 Major depressive disorder, single episode, moderate: Secondary | ICD-10-CM | POA: Diagnosis not present

## 2023-02-25 DIAGNOSIS — C519 Malignant neoplasm of vulva, unspecified: Secondary | ICD-10-CM | POA: Diagnosis not present

## 2023-02-25 DIAGNOSIS — F4311 Post-traumatic stress disorder, acute: Secondary | ICD-10-CM | POA: Diagnosis not present

## 2023-02-25 DIAGNOSIS — G4701 Insomnia due to medical condition: Secondary | ICD-10-CM | POA: Diagnosis not present

## 2023-02-26 DIAGNOSIS — Z5111 Encounter for antineoplastic chemotherapy: Secondary | ICD-10-CM | POA: Diagnosis not present

## 2023-02-26 DIAGNOSIS — C519 Malignant neoplasm of vulva, unspecified: Secondary | ICD-10-CM | POA: Diagnosis not present

## 2023-02-26 DIAGNOSIS — Z510 Encounter for antineoplastic radiation therapy: Secondary | ICD-10-CM | POA: Diagnosis not present

## 2023-02-26 DIAGNOSIS — Z79899 Other long term (current) drug therapy: Secondary | ICD-10-CM | POA: Diagnosis not present

## 2023-02-27 DIAGNOSIS — C519 Malignant neoplasm of vulva, unspecified: Secondary | ICD-10-CM | POA: Diagnosis not present

## 2023-02-28 DIAGNOSIS — C519 Malignant neoplasm of vulva, unspecified: Secondary | ICD-10-CM | POA: Diagnosis not present

## 2023-03-01 DIAGNOSIS — C519 Malignant neoplasm of vulva, unspecified: Secondary | ICD-10-CM | POA: Diagnosis not present

## 2023-03-01 DIAGNOSIS — F4310 Post-traumatic stress disorder, unspecified: Secondary | ICD-10-CM | POA: Diagnosis not present

## 2023-03-04 DIAGNOSIS — C519 Malignant neoplasm of vulva, unspecified: Secondary | ICD-10-CM | POA: Diagnosis not present

## 2023-03-05 DIAGNOSIS — C519 Malignant neoplasm of vulva, unspecified: Secondary | ICD-10-CM | POA: Diagnosis not present

## 2023-03-05 DIAGNOSIS — Z5111 Encounter for antineoplastic chemotherapy: Secondary | ICD-10-CM | POA: Diagnosis not present

## 2023-03-05 DIAGNOSIS — Z510 Encounter for antineoplastic radiation therapy: Secondary | ICD-10-CM | POA: Diagnosis not present

## 2023-03-06 DIAGNOSIS — C519 Malignant neoplasm of vulva, unspecified: Secondary | ICD-10-CM | POA: Diagnosis not present

## 2023-03-06 DIAGNOSIS — F4310 Post-traumatic stress disorder, unspecified: Secondary | ICD-10-CM | POA: Diagnosis not present

## 2023-03-07 DIAGNOSIS — C519 Malignant neoplasm of vulva, unspecified: Secondary | ICD-10-CM | POA: Diagnosis not present

## 2023-03-08 DIAGNOSIS — C519 Malignant neoplasm of vulva, unspecified: Secondary | ICD-10-CM | POA: Diagnosis not present

## 2023-03-11 DIAGNOSIS — R110 Nausea: Secondary | ICD-10-CM | POA: Diagnosis not present

## 2023-03-11 DIAGNOSIS — Z515 Encounter for palliative care: Secondary | ICD-10-CM | POA: Diagnosis not present

## 2023-03-11 DIAGNOSIS — G893 Neoplasm related pain (acute) (chronic): Secondary | ICD-10-CM | POA: Diagnosis not present

## 2023-03-11 DIAGNOSIS — K5903 Drug induced constipation: Secondary | ICD-10-CM | POA: Diagnosis not present

## 2023-03-15 DIAGNOSIS — F4310 Post-traumatic stress disorder, unspecified: Secondary | ICD-10-CM | POA: Diagnosis not present

## 2023-03-22 DIAGNOSIS — F4310 Post-traumatic stress disorder, unspecified: Secondary | ICD-10-CM | POA: Diagnosis not present

## 2023-03-28 DIAGNOSIS — C519 Malignant neoplasm of vulva, unspecified: Secondary | ICD-10-CM | POA: Diagnosis not present

## 2023-03-29 DIAGNOSIS — F4310 Post-traumatic stress disorder, unspecified: Secondary | ICD-10-CM | POA: Diagnosis not present

## 2023-04-01 DIAGNOSIS — R590 Localized enlarged lymph nodes: Secondary | ICD-10-CM | POA: Diagnosis not present

## 2023-04-01 DIAGNOSIS — C519 Malignant neoplasm of vulva, unspecified: Secondary | ICD-10-CM | POA: Diagnosis not present

## 2023-04-01 DIAGNOSIS — Z8589 Personal history of malignant neoplasm of other organs and systems: Secondary | ICD-10-CM | POA: Diagnosis not present

## 2023-04-03 DIAGNOSIS — F4310 Post-traumatic stress disorder, unspecified: Secondary | ICD-10-CM | POA: Diagnosis not present

## 2023-04-08 DIAGNOSIS — Z515 Encounter for palliative care: Secondary | ICD-10-CM | POA: Diagnosis not present

## 2023-04-08 DIAGNOSIS — M792 Neuralgia and neuritis, unspecified: Secondary | ICD-10-CM | POA: Diagnosis not present

## 2023-04-08 DIAGNOSIS — G893 Neoplasm related pain (acute) (chronic): Secondary | ICD-10-CM | POA: Diagnosis not present

## 2023-04-08 DIAGNOSIS — K5903 Drug induced constipation: Secondary | ICD-10-CM | POA: Diagnosis not present

## 2023-04-12 DIAGNOSIS — F4310 Post-traumatic stress disorder, unspecified: Secondary | ICD-10-CM | POA: Diagnosis not present

## 2023-04-17 DIAGNOSIS — F411 Generalized anxiety disorder: Secondary | ICD-10-CM | POA: Diagnosis not present

## 2023-04-17 DIAGNOSIS — F4311 Post-traumatic stress disorder, acute: Secondary | ICD-10-CM | POA: Diagnosis not present

## 2023-04-17 DIAGNOSIS — F321 Major depressive disorder, single episode, moderate: Secondary | ICD-10-CM | POA: Diagnosis not present

## 2023-04-17 DIAGNOSIS — G4701 Insomnia due to medical condition: Secondary | ICD-10-CM | POA: Diagnosis not present

## 2023-04-23 DIAGNOSIS — Z7189 Other specified counseling: Secondary | ICD-10-CM | POA: Diagnosis not present

## 2023-04-23 DIAGNOSIS — F4310 Post-traumatic stress disorder, unspecified: Secondary | ICD-10-CM | POA: Diagnosis not present

## 2023-04-23 DIAGNOSIS — R590 Localized enlarged lymph nodes: Secondary | ICD-10-CM | POA: Diagnosis not present

## 2023-04-23 DIAGNOSIS — T148XXD Other injury of unspecified body region, subsequent encounter: Secondary | ICD-10-CM | POA: Diagnosis not present

## 2023-04-23 DIAGNOSIS — Z79899 Other long term (current) drug therapy: Secondary | ICD-10-CM | POA: Diagnosis not present

## 2023-04-23 DIAGNOSIS — Z9221 Personal history of antineoplastic chemotherapy: Secondary | ICD-10-CM | POA: Diagnosis not present

## 2023-04-23 DIAGNOSIS — H9190 Unspecified hearing loss, unspecified ear: Secondary | ICD-10-CM | POA: Diagnosis not present

## 2023-04-23 DIAGNOSIS — Z923 Personal history of irradiation: Secondary | ICD-10-CM | POA: Diagnosis not present

## 2023-04-23 DIAGNOSIS — C519 Malignant neoplasm of vulva, unspecified: Secondary | ICD-10-CM | POA: Diagnosis not present

## 2023-04-23 DIAGNOSIS — C512 Malignant neoplasm of clitoris: Secondary | ICD-10-CM | POA: Diagnosis not present

## 2023-05-01 DIAGNOSIS — F4310 Post-traumatic stress disorder, unspecified: Secondary | ICD-10-CM | POA: Diagnosis not present

## 2023-05-03 DIAGNOSIS — T148XXD Other injury of unspecified body region, subsequent encounter: Secondary | ICD-10-CM | POA: Diagnosis not present

## 2023-05-03 DIAGNOSIS — L598 Other specified disorders of the skin and subcutaneous tissue related to radiation: Secondary | ICD-10-CM | POA: Diagnosis not present

## 2023-05-03 DIAGNOSIS — Z923 Personal history of irradiation: Secondary | ICD-10-CM | POA: Diagnosis not present

## 2023-05-03 DIAGNOSIS — R234 Changes in skin texture: Secondary | ICD-10-CM | POA: Diagnosis not present

## 2023-05-03 DIAGNOSIS — R590 Localized enlarged lymph nodes: Secondary | ICD-10-CM | POA: Diagnosis not present

## 2023-05-03 DIAGNOSIS — X58XXXD Exposure to other specified factors, subsequent encounter: Secondary | ICD-10-CM | POA: Diagnosis not present

## 2023-05-03 DIAGNOSIS — F4310 Post-traumatic stress disorder, unspecified: Secondary | ICD-10-CM | POA: Diagnosis not present

## 2023-05-03 DIAGNOSIS — Y842 Radiological procedure and radiotherapy as the cause of abnormal reaction of the patient, or of later complication, without mention of misadventure at the time of the procedure: Secondary | ICD-10-CM | POA: Diagnosis not present

## 2023-05-03 DIAGNOSIS — Z9221 Personal history of antineoplastic chemotherapy: Secondary | ICD-10-CM | POA: Diagnosis not present

## 2023-05-03 DIAGNOSIS — H9190 Unspecified hearing loss, unspecified ear: Secondary | ICD-10-CM | POA: Diagnosis not present

## 2023-05-07 DIAGNOSIS — C519 Malignant neoplasm of vulva, unspecified: Secondary | ICD-10-CM | POA: Diagnosis not present

## 2023-05-14 DIAGNOSIS — C519 Malignant neoplasm of vulva, unspecified: Secondary | ICD-10-CM | POA: Diagnosis not present

## 2023-05-14 DIAGNOSIS — D649 Anemia, unspecified: Secondary | ICD-10-CM | POA: Diagnosis not present

## 2023-05-15 DIAGNOSIS — F4310 Post-traumatic stress disorder, unspecified: Secondary | ICD-10-CM | POA: Diagnosis not present

## 2023-05-17 DIAGNOSIS — Z08 Encounter for follow-up examination after completed treatment for malignant neoplasm: Secondary | ICD-10-CM | POA: Diagnosis not present

## 2023-05-17 DIAGNOSIS — R590 Localized enlarged lymph nodes: Secondary | ICD-10-CM | POA: Diagnosis not present

## 2023-05-17 DIAGNOSIS — N766 Ulceration of vulva: Secondary | ICD-10-CM | POA: Diagnosis not present

## 2023-05-17 DIAGNOSIS — Z923 Personal history of irradiation: Secondary | ICD-10-CM | POA: Diagnosis not present

## 2023-05-17 DIAGNOSIS — C519 Malignant neoplasm of vulva, unspecified: Secondary | ICD-10-CM | POA: Diagnosis not present

## 2023-05-17 DIAGNOSIS — C774 Secondary and unspecified malignant neoplasm of inguinal and lower limb lymph nodes: Secondary | ICD-10-CM | POA: Diagnosis not present

## 2023-05-20 DIAGNOSIS — G893 Neoplasm related pain (acute) (chronic): Secondary | ICD-10-CM | POA: Diagnosis not present

## 2023-05-20 DIAGNOSIS — K5903 Drug induced constipation: Secondary | ICD-10-CM | POA: Diagnosis not present

## 2023-05-20 DIAGNOSIS — M792 Neuralgia and neuritis, unspecified: Secondary | ICD-10-CM | POA: Diagnosis not present

## 2023-05-20 DIAGNOSIS — Z515 Encounter for palliative care: Secondary | ICD-10-CM | POA: Diagnosis not present

## 2023-05-28 DIAGNOSIS — C519 Malignant neoplasm of vulva, unspecified: Secondary | ICD-10-CM | POA: Diagnosis not present

## 2023-05-28 DIAGNOSIS — R590 Localized enlarged lymph nodes: Secondary | ICD-10-CM | POA: Diagnosis not present

## 2023-05-28 DIAGNOSIS — D649 Anemia, unspecified: Secondary | ICD-10-CM | POA: Diagnosis not present

## 2023-05-28 DIAGNOSIS — Z7189 Other specified counseling: Secondary | ICD-10-CM | POA: Diagnosis not present

## 2023-05-28 DIAGNOSIS — X58XXXD Exposure to other specified factors, subsequent encounter: Secondary | ICD-10-CM | POA: Diagnosis not present

## 2023-05-28 DIAGNOSIS — T148XXD Other injury of unspecified body region, subsequent encounter: Secondary | ICD-10-CM | POA: Diagnosis not present

## 2023-05-31 DIAGNOSIS — F4310 Post-traumatic stress disorder, unspecified: Secondary | ICD-10-CM | POA: Diagnosis not present

## 2023-05-31 DIAGNOSIS — C519 Malignant neoplasm of vulva, unspecified: Secondary | ICD-10-CM | POA: Diagnosis not present

## 2023-05-31 DIAGNOSIS — R600 Localized edema: Secondary | ICD-10-CM | POA: Diagnosis not present

## 2023-06-06 DIAGNOSIS — Z880 Allergy status to penicillin: Secondary | ICD-10-CM | POA: Diagnosis not present

## 2023-06-06 DIAGNOSIS — Z8544 Personal history of malignant neoplasm of other female genital organs: Secondary | ICD-10-CM | POA: Diagnosis not present

## 2023-06-06 DIAGNOSIS — R102 Pelvic and perineal pain: Secondary | ICD-10-CM | POA: Diagnosis not present

## 2023-06-06 DIAGNOSIS — D649 Anemia, unspecified: Secondary | ICD-10-CM | POA: Diagnosis not present

## 2023-06-06 DIAGNOSIS — Z9221 Personal history of antineoplastic chemotherapy: Secondary | ICD-10-CM | POA: Diagnosis not present

## 2023-06-06 DIAGNOSIS — Z79899 Other long term (current) drug therapy: Secondary | ICD-10-CM | POA: Diagnosis not present

## 2023-06-06 DIAGNOSIS — C519 Malignant neoplasm of vulva, unspecified: Secondary | ICD-10-CM | POA: Diagnosis not present

## 2023-06-06 DIAGNOSIS — F32A Depression, unspecified: Secondary | ICD-10-CM | POA: Diagnosis not present

## 2023-06-06 DIAGNOSIS — Z9889 Other specified postprocedural states: Secondary | ICD-10-CM | POA: Diagnosis not present

## 2023-06-06 DIAGNOSIS — F4310 Post-traumatic stress disorder, unspecified: Secondary | ICD-10-CM | POA: Diagnosis not present

## 2023-06-06 DIAGNOSIS — K219 Gastro-esophageal reflux disease without esophagitis: Secondary | ICD-10-CM | POA: Diagnosis not present

## 2023-06-06 DIAGNOSIS — N9089 Other specified noninflammatory disorders of vulva and perineum: Secondary | ICD-10-CM | POA: Diagnosis not present

## 2023-06-06 DIAGNOSIS — Z923 Personal history of irradiation: Secondary | ICD-10-CM | POA: Diagnosis not present

## 2023-06-14 DIAGNOSIS — F4310 Post-traumatic stress disorder, unspecified: Secondary | ICD-10-CM | POA: Diagnosis not present

## 2023-06-21 DIAGNOSIS — N7689 Other specified inflammation of vagina and vulva: Secondary | ICD-10-CM | POA: Diagnosis not present

## 2023-06-21 DIAGNOSIS — R509 Fever, unspecified: Secondary | ICD-10-CM | POA: Diagnosis not present

## 2023-06-21 DIAGNOSIS — Z923 Personal history of irradiation: Secondary | ICD-10-CM | POA: Diagnosis not present

## 2023-06-21 DIAGNOSIS — D6489 Other specified anemias: Secondary | ICD-10-CM | POA: Diagnosis not present

## 2023-06-21 DIAGNOSIS — R609 Edema, unspecified: Secondary | ICD-10-CM | POA: Diagnosis not present

## 2023-06-21 DIAGNOSIS — K921 Melena: Secondary | ICD-10-CM | POA: Diagnosis not present

## 2023-06-21 DIAGNOSIS — F419 Anxiety disorder, unspecified: Secondary | ICD-10-CM | POA: Diagnosis not present

## 2023-06-21 DIAGNOSIS — K219 Gastro-esophageal reflux disease without esophagitis: Secondary | ICD-10-CM | POA: Diagnosis not present

## 2023-06-21 DIAGNOSIS — R110 Nausea: Secondary | ICD-10-CM | POA: Diagnosis not present

## 2023-06-21 DIAGNOSIS — G8918 Other acute postprocedural pain: Secondary | ICD-10-CM | POA: Diagnosis not present

## 2023-06-21 DIAGNOSIS — Z515 Encounter for palliative care: Secondary | ICD-10-CM | POA: Diagnosis not present

## 2023-06-21 DIAGNOSIS — H9190 Unspecified hearing loss, unspecified ear: Secondary | ICD-10-CM | POA: Diagnosis not present

## 2023-06-21 DIAGNOSIS — R319 Hematuria, unspecified: Secondary | ICD-10-CM | POA: Diagnosis not present

## 2023-06-21 DIAGNOSIS — G893 Neoplasm related pain (acute) (chronic): Secondary | ICD-10-CM | POA: Diagnosis not present

## 2023-06-21 DIAGNOSIS — C774 Secondary and unspecified malignant neoplasm of inguinal and lower limb lymph nodes: Secondary | ICD-10-CM | POA: Diagnosis not present

## 2023-06-21 DIAGNOSIS — Y842 Radiological procedure and radiotherapy as the cause of abnormal reaction of the patient, or of later complication, without mention of misadventure at the time of the procedure: Secondary | ICD-10-CM | POA: Diagnosis not present

## 2023-06-21 DIAGNOSIS — M7989 Other specified soft tissue disorders: Secondary | ICD-10-CM | POA: Diagnosis not present

## 2023-06-21 DIAGNOSIS — F4310 Post-traumatic stress disorder, unspecified: Secondary | ICD-10-CM | POA: Diagnosis not present

## 2023-06-21 DIAGNOSIS — K654 Sclerosing mesenteritis: Secondary | ICD-10-CM | POA: Diagnosis not present

## 2023-06-21 DIAGNOSIS — Z7409 Other reduced mobility: Secondary | ICD-10-CM | POA: Diagnosis not present

## 2023-06-21 DIAGNOSIS — R102 Pelvic and perineal pain: Secondary | ICD-10-CM | POA: Diagnosis not present

## 2023-06-21 DIAGNOSIS — F32A Depression, unspecified: Secondary | ICD-10-CM | POA: Diagnosis not present

## 2023-06-21 DIAGNOSIS — N898 Other specified noninflammatory disorders of vagina: Secondary | ICD-10-CM | POA: Diagnosis not present

## 2023-06-21 DIAGNOSIS — N739 Female pelvic inflammatory disease, unspecified: Secondary | ICD-10-CM | POA: Diagnosis not present

## 2023-06-21 DIAGNOSIS — I898 Other specified noninfective disorders of lymphatic vessels and lymph nodes: Secondary | ICD-10-CM | POA: Diagnosis not present

## 2023-06-21 DIAGNOSIS — I2510 Atherosclerotic heart disease of native coronary artery without angina pectoris: Secondary | ICD-10-CM | POA: Diagnosis not present

## 2023-06-21 DIAGNOSIS — N766 Ulceration of vulva: Secondary | ICD-10-CM | POA: Diagnosis not present

## 2023-06-21 DIAGNOSIS — N9089 Other specified noninflammatory disorders of vulva and perineum: Secondary | ICD-10-CM | POA: Diagnosis not present

## 2023-06-21 DIAGNOSIS — Z9221 Personal history of antineoplastic chemotherapy: Secondary | ICD-10-CM | POA: Diagnosis not present

## 2023-06-21 DIAGNOSIS — C775 Secondary and unspecified malignant neoplasm of intrapelvic lymph nodes: Secondary | ICD-10-CM | POA: Diagnosis not present

## 2023-06-21 DIAGNOSIS — L598 Other specified disorders of the skin and subcutaneous tissue related to radiation: Secondary | ICD-10-CM | POA: Diagnosis not present

## 2023-06-21 DIAGNOSIS — N762 Acute vulvitis: Secondary | ICD-10-CM | POA: Diagnosis not present

## 2023-06-21 DIAGNOSIS — T8131XA Disruption of external operation (surgical) wound, not elsewhere classified, initial encounter: Secondary | ICD-10-CM | POA: Diagnosis not present

## 2023-06-21 DIAGNOSIS — N760 Acute vaginitis: Secondary | ICD-10-CM | POA: Diagnosis not present

## 2023-06-21 DIAGNOSIS — N3289 Other specified disorders of bladder: Secondary | ICD-10-CM | POA: Diagnosis not present

## 2023-06-21 DIAGNOSIS — I959 Hypotension, unspecified: Secondary | ICD-10-CM | POA: Diagnosis not present

## 2023-06-21 DIAGNOSIS — T86821 Skin graft (allograft) (autograft) failure: Secondary | ICD-10-CM | POA: Diagnosis not present

## 2023-06-21 DIAGNOSIS — J45909 Unspecified asthma, uncomplicated: Secondary | ICD-10-CM | POA: Diagnosis not present

## 2023-06-21 DIAGNOSIS — C512 Malignant neoplasm of clitoris: Secondary | ICD-10-CM | POA: Diagnosis not present

## 2023-06-21 DIAGNOSIS — Z880 Allergy status to penicillin: Secondary | ICD-10-CM | POA: Diagnosis not present

## 2023-06-21 DIAGNOSIS — K5903 Drug induced constipation: Secondary | ICD-10-CM | POA: Diagnosis not present

## 2023-06-21 DIAGNOSIS — Z79899 Other long term (current) drug therapy: Secondary | ICD-10-CM | POA: Diagnosis not present

## 2023-06-21 DIAGNOSIS — R000 Tachycardia, unspecified: Secondary | ICD-10-CM | POA: Diagnosis not present

## 2023-06-21 DIAGNOSIS — C519 Malignant neoplasm of vulva, unspecified: Secondary | ICD-10-CM | POA: Diagnosis not present

## 2023-06-21 DIAGNOSIS — N949 Unspecified condition associated with female genital organs and menstrual cycle: Secondary | ICD-10-CM | POA: Diagnosis not present

## 2023-06-22 DIAGNOSIS — K921 Melena: Secondary | ICD-10-CM | POA: Diagnosis not present

## 2023-06-22 DIAGNOSIS — R102 Pelvic and perineal pain: Secondary | ICD-10-CM | POA: Diagnosis not present

## 2023-06-22 DIAGNOSIS — N766 Ulceration of vulva: Secondary | ICD-10-CM | POA: Diagnosis not present

## 2023-06-22 DIAGNOSIS — R509 Fever, unspecified: Secondary | ICD-10-CM | POA: Diagnosis not present

## 2023-06-22 DIAGNOSIS — R319 Hematuria, unspecified: Secondary | ICD-10-CM | POA: Diagnosis not present

## 2023-06-23 DIAGNOSIS — N9089 Other specified noninflammatory disorders of vulva and perineum: Secondary | ICD-10-CM | POA: Diagnosis not present

## 2023-06-23 DIAGNOSIS — G893 Neoplasm related pain (acute) (chronic): Secondary | ICD-10-CM | POA: Diagnosis not present

## 2023-06-23 DIAGNOSIS — I898 Other specified noninfective disorders of lymphatic vessels and lymph nodes: Secondary | ICD-10-CM | POA: Diagnosis not present

## 2023-06-23 DIAGNOSIS — N898 Other specified noninflammatory disorders of vagina: Secondary | ICD-10-CM | POA: Diagnosis not present

## 2023-06-23 DIAGNOSIS — C519 Malignant neoplasm of vulva, unspecified: Secondary | ICD-10-CM | POA: Diagnosis not present

## 2023-06-25 DIAGNOSIS — Z515 Encounter for palliative care: Secondary | ICD-10-CM | POA: Diagnosis not present

## 2023-06-25 DIAGNOSIS — C519 Malignant neoplasm of vulva, unspecified: Secondary | ICD-10-CM | POA: Diagnosis not present

## 2023-06-25 DIAGNOSIS — G893 Neoplasm related pain (acute) (chronic): Secondary | ICD-10-CM | POA: Diagnosis not present

## 2023-06-25 DIAGNOSIS — N949 Unspecified condition associated with female genital organs and menstrual cycle: Secondary | ICD-10-CM | POA: Diagnosis not present

## 2023-06-26 DIAGNOSIS — Z515 Encounter for palliative care: Secondary | ICD-10-CM | POA: Diagnosis not present

## 2023-06-26 DIAGNOSIS — C519 Malignant neoplasm of vulva, unspecified: Secondary | ICD-10-CM | POA: Diagnosis not present

## 2023-06-26 DIAGNOSIS — N949 Unspecified condition associated with female genital organs and menstrual cycle: Secondary | ICD-10-CM | POA: Diagnosis not present

## 2023-06-26 DIAGNOSIS — G893 Neoplasm related pain (acute) (chronic): Secondary | ICD-10-CM | POA: Diagnosis not present

## 2023-06-27 DIAGNOSIS — N760 Acute vaginitis: Secondary | ICD-10-CM | POA: Diagnosis not present

## 2023-06-27 DIAGNOSIS — F4310 Post-traumatic stress disorder, unspecified: Secondary | ICD-10-CM | POA: Diagnosis not present

## 2023-06-27 DIAGNOSIS — G8918 Other acute postprocedural pain: Secondary | ICD-10-CM | POA: Diagnosis not present

## 2023-06-27 DIAGNOSIS — N739 Female pelvic inflammatory disease, unspecified: Secondary | ICD-10-CM | POA: Diagnosis not present

## 2023-06-27 DIAGNOSIS — C519 Malignant neoplasm of vulva, unspecified: Secondary | ICD-10-CM | POA: Diagnosis not present

## 2023-06-27 DIAGNOSIS — N766 Ulceration of vulva: Secondary | ICD-10-CM | POA: Diagnosis not present

## 2023-06-27 DIAGNOSIS — Z515 Encounter for palliative care: Secondary | ICD-10-CM | POA: Diagnosis not present

## 2023-06-27 DIAGNOSIS — N949 Unspecified condition associated with female genital organs and menstrual cycle: Secondary | ICD-10-CM | POA: Diagnosis not present

## 2023-06-27 DIAGNOSIS — C774 Secondary and unspecified malignant neoplasm of inguinal and lower limb lymph nodes: Secondary | ICD-10-CM | POA: Diagnosis not present

## 2023-06-27 DIAGNOSIS — C775 Secondary and unspecified malignant neoplasm of intrapelvic lymph nodes: Secondary | ICD-10-CM | POA: Diagnosis not present

## 2023-06-27 DIAGNOSIS — K654 Sclerosing mesenteritis: Secondary | ICD-10-CM | POA: Diagnosis not present

## 2023-06-27 DIAGNOSIS — G893 Neoplasm related pain (acute) (chronic): Secondary | ICD-10-CM | POA: Diagnosis not present

## 2023-06-27 DIAGNOSIS — N7689 Other specified inflammation of vagina and vulva: Secondary | ICD-10-CM | POA: Diagnosis not present

## 2023-06-27 DIAGNOSIS — N762 Acute vulvitis: Secondary | ICD-10-CM | POA: Diagnosis not present

## 2023-06-28 DIAGNOSIS — Z515 Encounter for palliative care: Secondary | ICD-10-CM | POA: Diagnosis not present

## 2023-06-28 DIAGNOSIS — G893 Neoplasm related pain (acute) (chronic): Secondary | ICD-10-CM | POA: Diagnosis not present

## 2023-06-28 DIAGNOSIS — N949 Unspecified condition associated with female genital organs and menstrual cycle: Secondary | ICD-10-CM | POA: Diagnosis not present

## 2023-06-28 DIAGNOSIS — M7989 Other specified soft tissue disorders: Secondary | ICD-10-CM | POA: Diagnosis not present

## 2023-06-28 DIAGNOSIS — G8918 Other acute postprocedural pain: Secondary | ICD-10-CM | POA: Diagnosis not present

## 2023-06-28 DIAGNOSIS — C519 Malignant neoplasm of vulva, unspecified: Secondary | ICD-10-CM | POA: Diagnosis not present

## 2023-06-29 DIAGNOSIS — C519 Malignant neoplasm of vulva, unspecified: Secondary | ICD-10-CM | POA: Diagnosis not present

## 2023-06-29 DIAGNOSIS — G8918 Other acute postprocedural pain: Secondary | ICD-10-CM | POA: Diagnosis not present

## 2023-06-30 DIAGNOSIS — N949 Unspecified condition associated with female genital organs and menstrual cycle: Secondary | ICD-10-CM | POA: Diagnosis not present

## 2023-06-30 DIAGNOSIS — G8918 Other acute postprocedural pain: Secondary | ICD-10-CM | POA: Diagnosis not present

## 2023-06-30 DIAGNOSIS — C519 Malignant neoplasm of vulva, unspecified: Secondary | ICD-10-CM | POA: Diagnosis not present

## 2023-07-01 DIAGNOSIS — G8918 Other acute postprocedural pain: Secondary | ICD-10-CM | POA: Diagnosis not present

## 2023-07-01 DIAGNOSIS — C519 Malignant neoplasm of vulva, unspecified: Secondary | ICD-10-CM | POA: Diagnosis not present

## 2023-07-01 DIAGNOSIS — N949 Unspecified condition associated with female genital organs and menstrual cycle: Secondary | ICD-10-CM | POA: Diagnosis not present

## 2023-07-02 DIAGNOSIS — G8918 Other acute postprocedural pain: Secondary | ICD-10-CM | POA: Diagnosis not present

## 2023-07-02 DIAGNOSIS — C519 Malignant neoplasm of vulva, unspecified: Secondary | ICD-10-CM | POA: Diagnosis not present

## 2023-07-02 DIAGNOSIS — N949 Unspecified condition associated with female genital organs and menstrual cycle: Secondary | ICD-10-CM | POA: Diagnosis not present

## 2023-07-02 DIAGNOSIS — Z515 Encounter for palliative care: Secondary | ICD-10-CM | POA: Diagnosis not present

## 2023-07-02 DIAGNOSIS — G893 Neoplasm related pain (acute) (chronic): Secondary | ICD-10-CM | POA: Diagnosis not present

## 2023-07-03 DIAGNOSIS — G8918 Other acute postprocedural pain: Secondary | ICD-10-CM | POA: Diagnosis not present

## 2023-07-03 DIAGNOSIS — Z515 Encounter for palliative care: Secondary | ICD-10-CM | POA: Diagnosis not present

## 2023-07-03 DIAGNOSIS — N949 Unspecified condition associated with female genital organs and menstrual cycle: Secondary | ICD-10-CM | POA: Diagnosis not present

## 2023-07-03 DIAGNOSIS — C519 Malignant neoplasm of vulva, unspecified: Secondary | ICD-10-CM | POA: Diagnosis not present

## 2023-07-03 DIAGNOSIS — G893 Neoplasm related pain (acute) (chronic): Secondary | ICD-10-CM | POA: Diagnosis not present

## 2023-07-04 DIAGNOSIS — G8918 Other acute postprocedural pain: Secondary | ICD-10-CM | POA: Diagnosis not present

## 2023-07-04 DIAGNOSIS — C519 Malignant neoplasm of vulva, unspecified: Secondary | ICD-10-CM | POA: Diagnosis not present

## 2023-07-05 DIAGNOSIS — N949 Unspecified condition associated with female genital organs and menstrual cycle: Secondary | ICD-10-CM | POA: Diagnosis not present

## 2023-07-05 DIAGNOSIS — Z515 Encounter for palliative care: Secondary | ICD-10-CM | POA: Diagnosis not present

## 2023-07-05 DIAGNOSIS — C519 Malignant neoplasm of vulva, unspecified: Secondary | ICD-10-CM | POA: Diagnosis not present

## 2023-07-05 DIAGNOSIS — G8918 Other acute postprocedural pain: Secondary | ICD-10-CM | POA: Diagnosis not present

## 2023-07-05 DIAGNOSIS — G893 Neoplasm related pain (acute) (chronic): Secondary | ICD-10-CM | POA: Diagnosis not present

## 2023-07-06 DIAGNOSIS — K5903 Drug induced constipation: Secondary | ICD-10-CM | POA: Diagnosis not present

## 2023-07-06 DIAGNOSIS — G893 Neoplasm related pain (acute) (chronic): Secondary | ICD-10-CM | POA: Diagnosis not present

## 2023-07-06 DIAGNOSIS — G8918 Other acute postprocedural pain: Secondary | ICD-10-CM | POA: Diagnosis not present

## 2023-07-06 DIAGNOSIS — N949 Unspecified condition associated with female genital organs and menstrual cycle: Secondary | ICD-10-CM | POA: Diagnosis not present

## 2023-07-06 DIAGNOSIS — R102 Pelvic and perineal pain: Secondary | ICD-10-CM | POA: Diagnosis not present

## 2023-07-06 DIAGNOSIS — C519 Malignant neoplasm of vulva, unspecified: Secondary | ICD-10-CM | POA: Diagnosis not present

## 2023-07-07 DIAGNOSIS — N949 Unspecified condition associated with female genital organs and menstrual cycle: Secondary | ICD-10-CM | POA: Diagnosis not present

## 2023-07-07 DIAGNOSIS — G893 Neoplasm related pain (acute) (chronic): Secondary | ICD-10-CM | POA: Diagnosis not present

## 2023-07-07 DIAGNOSIS — K5903 Drug induced constipation: Secondary | ICD-10-CM | POA: Diagnosis not present

## 2023-07-07 DIAGNOSIS — C519 Malignant neoplasm of vulva, unspecified: Secondary | ICD-10-CM | POA: Diagnosis not present

## 2023-07-07 DIAGNOSIS — R102 Pelvic and perineal pain: Secondary | ICD-10-CM | POA: Diagnosis not present

## 2023-07-07 DIAGNOSIS — G8918 Other acute postprocedural pain: Secondary | ICD-10-CM | POA: Diagnosis not present

## 2023-07-08 DIAGNOSIS — I2510 Atherosclerotic heart disease of native coronary artery without angina pectoris: Secondary | ICD-10-CM | POA: Diagnosis not present

## 2023-07-08 DIAGNOSIS — G8918 Other acute postprocedural pain: Secondary | ICD-10-CM | POA: Diagnosis not present

## 2023-07-08 DIAGNOSIS — T86821 Skin graft (allograft) (autograft) failure: Secondary | ICD-10-CM | POA: Diagnosis not present

## 2023-07-08 DIAGNOSIS — L598 Other specified disorders of the skin and subcutaneous tissue related to radiation: Secondary | ICD-10-CM | POA: Diagnosis not present

## 2023-07-08 DIAGNOSIS — C519 Malignant neoplasm of vulva, unspecified: Secondary | ICD-10-CM | POA: Diagnosis not present

## 2023-07-08 DIAGNOSIS — Z515 Encounter for palliative care: Secondary | ICD-10-CM | POA: Diagnosis not present

## 2023-07-08 DIAGNOSIS — Y842 Radiological procedure and radiotherapy as the cause of abnormal reaction of the patient, or of later complication, without mention of misadventure at the time of the procedure: Secondary | ICD-10-CM | POA: Diagnosis not present

## 2023-07-08 DIAGNOSIS — N949 Unspecified condition associated with female genital organs and menstrual cycle: Secondary | ICD-10-CM | POA: Diagnosis not present

## 2023-07-09 DIAGNOSIS — G8918 Other acute postprocedural pain: Secondary | ICD-10-CM | POA: Diagnosis not present

## 2023-07-09 DIAGNOSIS — L598 Other specified disorders of the skin and subcutaneous tissue related to radiation: Secondary | ICD-10-CM | POA: Diagnosis not present

## 2023-07-09 DIAGNOSIS — N949 Unspecified condition associated with female genital organs and menstrual cycle: Secondary | ICD-10-CM | POA: Diagnosis not present

## 2023-07-09 DIAGNOSIS — Y842 Radiological procedure and radiotherapy as the cause of abnormal reaction of the patient, or of later complication, without mention of misadventure at the time of the procedure: Secondary | ICD-10-CM | POA: Diagnosis not present

## 2023-07-09 DIAGNOSIS — Z515 Encounter for palliative care: Secondary | ICD-10-CM | POA: Diagnosis not present

## 2023-07-09 DIAGNOSIS — T86821 Skin graft (allograft) (autograft) failure: Secondary | ICD-10-CM | POA: Diagnosis not present

## 2023-07-09 DIAGNOSIS — C519 Malignant neoplasm of vulva, unspecified: Secondary | ICD-10-CM | POA: Diagnosis not present

## 2023-07-10 DIAGNOSIS — G8918 Other acute postprocedural pain: Secondary | ICD-10-CM | POA: Diagnosis not present

## 2023-07-10 DIAGNOSIS — T86821 Skin graft (allograft) (autograft) failure: Secondary | ICD-10-CM | POA: Diagnosis not present

## 2023-07-10 DIAGNOSIS — N949 Unspecified condition associated with female genital organs and menstrual cycle: Secondary | ICD-10-CM | POA: Diagnosis not present

## 2023-07-10 DIAGNOSIS — L598 Other specified disorders of the skin and subcutaneous tissue related to radiation: Secondary | ICD-10-CM | POA: Diagnosis not present

## 2023-07-10 DIAGNOSIS — Z515 Encounter for palliative care: Secondary | ICD-10-CM | POA: Diagnosis not present

## 2023-07-10 DIAGNOSIS — Y842 Radiological procedure and radiotherapy as the cause of abnormal reaction of the patient, or of later complication, without mention of misadventure at the time of the procedure: Secondary | ICD-10-CM | POA: Diagnosis not present

## 2023-07-10 DIAGNOSIS — C519 Malignant neoplasm of vulva, unspecified: Secondary | ICD-10-CM | POA: Diagnosis not present

## 2023-07-10 DIAGNOSIS — G893 Neoplasm related pain (acute) (chronic): Secondary | ICD-10-CM | POA: Diagnosis not present

## 2023-07-11 DIAGNOSIS — G8918 Other acute postprocedural pain: Secondary | ICD-10-CM | POA: Diagnosis not present

## 2023-07-11 DIAGNOSIS — G893 Neoplasm related pain (acute) (chronic): Secondary | ICD-10-CM | POA: Diagnosis not present

## 2023-07-11 DIAGNOSIS — C519 Malignant neoplasm of vulva, unspecified: Secondary | ICD-10-CM | POA: Diagnosis not present

## 2023-07-11 DIAGNOSIS — L598 Other specified disorders of the skin and subcutaneous tissue related to radiation: Secondary | ICD-10-CM | POA: Diagnosis not present

## 2023-07-11 DIAGNOSIS — T86821 Skin graft (allograft) (autograft) failure: Secondary | ICD-10-CM | POA: Diagnosis not present

## 2023-07-11 DIAGNOSIS — Z515 Encounter for palliative care: Secondary | ICD-10-CM | POA: Diagnosis not present

## 2023-07-11 DIAGNOSIS — Y842 Radiological procedure and radiotherapy as the cause of abnormal reaction of the patient, or of later complication, without mention of misadventure at the time of the procedure: Secondary | ICD-10-CM | POA: Diagnosis not present

## 2023-07-11 DIAGNOSIS — N949 Unspecified condition associated with female genital organs and menstrual cycle: Secondary | ICD-10-CM | POA: Diagnosis not present

## 2023-07-12 DIAGNOSIS — T86821 Skin graft (allograft) (autograft) failure: Secondary | ICD-10-CM | POA: Diagnosis not present

## 2023-07-12 DIAGNOSIS — C519 Malignant neoplasm of vulva, unspecified: Secondary | ICD-10-CM | POA: Diagnosis not present

## 2023-07-12 DIAGNOSIS — N949 Unspecified condition associated with female genital organs and menstrual cycle: Secondary | ICD-10-CM | POA: Diagnosis not present

## 2023-07-12 DIAGNOSIS — Y842 Radiological procedure and radiotherapy as the cause of abnormal reaction of the patient, or of later complication, without mention of misadventure at the time of the procedure: Secondary | ICD-10-CM | POA: Diagnosis not present

## 2023-07-12 DIAGNOSIS — G893 Neoplasm related pain (acute) (chronic): Secondary | ICD-10-CM | POA: Diagnosis not present

## 2023-07-12 DIAGNOSIS — L598 Other specified disorders of the skin and subcutaneous tissue related to radiation: Secondary | ICD-10-CM | POA: Diagnosis not present

## 2023-07-12 DIAGNOSIS — Z515 Encounter for palliative care: Secondary | ICD-10-CM | POA: Diagnosis not present

## 2023-07-13 DIAGNOSIS — L598 Other specified disorders of the skin and subcutaneous tissue related to radiation: Secondary | ICD-10-CM | POA: Diagnosis not present

## 2023-07-13 DIAGNOSIS — T86821 Skin graft (allograft) (autograft) failure: Secondary | ICD-10-CM | POA: Diagnosis not present

## 2023-07-13 DIAGNOSIS — Y842 Radiological procedure and radiotherapy as the cause of abnormal reaction of the patient, or of later complication, without mention of misadventure at the time of the procedure: Secondary | ICD-10-CM | POA: Diagnosis not present

## 2023-07-15 DIAGNOSIS — T86821 Skin graft (allograft) (autograft) failure: Secondary | ICD-10-CM | POA: Diagnosis not present

## 2023-07-16 DIAGNOSIS — T86821 Skin graft (allograft) (autograft) failure: Secondary | ICD-10-CM | POA: Diagnosis not present

## 2023-07-16 DIAGNOSIS — Y842 Radiological procedure and radiotherapy as the cause of abnormal reaction of the patient, or of later complication, without mention of misadventure at the time of the procedure: Secondary | ICD-10-CM | POA: Diagnosis not present

## 2023-07-16 DIAGNOSIS — Z515 Encounter for palliative care: Secondary | ICD-10-CM | POA: Diagnosis not present

## 2023-07-16 DIAGNOSIS — C519 Malignant neoplasm of vulva, unspecified: Secondary | ICD-10-CM | POA: Diagnosis not present

## 2023-07-16 DIAGNOSIS — G893 Neoplasm related pain (acute) (chronic): Secondary | ICD-10-CM | POA: Diagnosis not present

## 2023-07-16 DIAGNOSIS — L598 Other specified disorders of the skin and subcutaneous tissue related to radiation: Secondary | ICD-10-CM | POA: Diagnosis not present

## 2023-07-16 DIAGNOSIS — N949 Unspecified condition associated with female genital organs and menstrual cycle: Secondary | ICD-10-CM | POA: Diagnosis not present

## 2023-07-17 DIAGNOSIS — Y842 Radiological procedure and radiotherapy as the cause of abnormal reaction of the patient, or of later complication, without mention of misadventure at the time of the procedure: Secondary | ICD-10-CM | POA: Diagnosis not present

## 2023-07-17 DIAGNOSIS — N949 Unspecified condition associated with female genital organs and menstrual cycle: Secondary | ICD-10-CM | POA: Diagnosis not present

## 2023-07-17 DIAGNOSIS — L598 Other specified disorders of the skin and subcutaneous tissue related to radiation: Secondary | ICD-10-CM | POA: Diagnosis not present

## 2023-07-17 DIAGNOSIS — T86821 Skin graft (allograft) (autograft) failure: Secondary | ICD-10-CM | POA: Diagnosis not present

## 2023-07-17 DIAGNOSIS — G893 Neoplasm related pain (acute) (chronic): Secondary | ICD-10-CM | POA: Diagnosis not present

## 2023-07-17 DIAGNOSIS — C519 Malignant neoplasm of vulva, unspecified: Secondary | ICD-10-CM | POA: Diagnosis not present

## 2023-07-17 DIAGNOSIS — Z515 Encounter for palliative care: Secondary | ICD-10-CM | POA: Diagnosis not present

## 2023-07-18 DIAGNOSIS — Y842 Radiological procedure and radiotherapy as the cause of abnormal reaction of the patient, or of later complication, without mention of misadventure at the time of the procedure: Secondary | ICD-10-CM | POA: Diagnosis not present

## 2023-07-18 DIAGNOSIS — T86821 Skin graft (allograft) (autograft) failure: Secondary | ICD-10-CM | POA: Diagnosis not present

## 2023-07-18 DIAGNOSIS — L598 Other specified disorders of the skin and subcutaneous tissue related to radiation: Secondary | ICD-10-CM | POA: Diagnosis not present

## 2023-07-19 DIAGNOSIS — G8918 Other acute postprocedural pain: Secondary | ICD-10-CM | POA: Diagnosis not present

## 2023-07-19 DIAGNOSIS — G893 Neoplasm related pain (acute) (chronic): Secondary | ICD-10-CM | POA: Diagnosis not present

## 2023-07-19 DIAGNOSIS — C519 Malignant neoplasm of vulva, unspecified: Secondary | ICD-10-CM | POA: Diagnosis not present

## 2023-07-19 DIAGNOSIS — N949 Unspecified condition associated with female genital organs and menstrual cycle: Secondary | ICD-10-CM | POA: Diagnosis not present

## 2023-07-19 DIAGNOSIS — Z515 Encounter for palliative care: Secondary | ICD-10-CM | POA: Diagnosis not present

## 2023-07-23 DIAGNOSIS — C519 Malignant neoplasm of vulva, unspecified: Secondary | ICD-10-CM | POA: Diagnosis not present

## 2023-07-23 DIAGNOSIS — Z466 Encounter for fitting and adjustment of urinary device: Secondary | ICD-10-CM | POA: Diagnosis not present

## 2023-07-23 DIAGNOSIS — T81328D Disruption or dehiscence of closure of other specified internal operation (surgical) wound, subsequent encounter: Secondary | ICD-10-CM | POA: Diagnosis not present

## 2023-07-23 DIAGNOSIS — G893 Neoplasm related pain (acute) (chronic): Secondary | ICD-10-CM | POA: Diagnosis not present

## 2023-07-23 DIAGNOSIS — R000 Tachycardia, unspecified: Secondary | ICD-10-CM | POA: Diagnosis not present

## 2023-07-23 DIAGNOSIS — Z791 Long term (current) use of non-steroidal anti-inflammatories (NSAID): Secondary | ICD-10-CM | POA: Diagnosis not present

## 2023-07-23 DIAGNOSIS — F4310 Post-traumatic stress disorder, unspecified: Secondary | ICD-10-CM | POA: Diagnosis not present

## 2023-07-23 DIAGNOSIS — F32A Depression, unspecified: Secondary | ICD-10-CM | POA: Diagnosis not present

## 2023-07-23 DIAGNOSIS — H9193 Unspecified hearing loss, bilateral: Secondary | ICD-10-CM | POA: Diagnosis not present

## 2023-07-23 DIAGNOSIS — K219 Gastro-esophageal reflux disease without esophagitis: Secondary | ICD-10-CM | POA: Diagnosis not present

## 2023-07-23 DIAGNOSIS — Z79899 Other long term (current) drug therapy: Secondary | ICD-10-CM | POA: Diagnosis not present

## 2023-07-25 DIAGNOSIS — G893 Neoplasm related pain (acute) (chronic): Secondary | ICD-10-CM | POA: Diagnosis not present

## 2023-07-25 DIAGNOSIS — H9193 Unspecified hearing loss, bilateral: Secondary | ICD-10-CM | POA: Diagnosis not present

## 2023-07-25 DIAGNOSIS — F32A Depression, unspecified: Secondary | ICD-10-CM | POA: Diagnosis not present

## 2023-07-25 DIAGNOSIS — Z466 Encounter for fitting and adjustment of urinary device: Secondary | ICD-10-CM | POA: Diagnosis not present

## 2023-07-25 DIAGNOSIS — C519 Malignant neoplasm of vulva, unspecified: Secondary | ICD-10-CM | POA: Diagnosis not present

## 2023-07-25 DIAGNOSIS — Z79899 Other long term (current) drug therapy: Secondary | ICD-10-CM | POA: Diagnosis not present

## 2023-07-25 DIAGNOSIS — F4310 Post-traumatic stress disorder, unspecified: Secondary | ICD-10-CM | POA: Diagnosis not present

## 2023-07-25 DIAGNOSIS — T81328D Disruption or dehiscence of closure of other specified internal operation (surgical) wound, subsequent encounter: Secondary | ICD-10-CM | POA: Diagnosis not present

## 2023-07-25 DIAGNOSIS — R000 Tachycardia, unspecified: Secondary | ICD-10-CM | POA: Diagnosis not present

## 2023-07-25 DIAGNOSIS — Z791 Long term (current) use of non-steroidal anti-inflammatories (NSAID): Secondary | ICD-10-CM | POA: Diagnosis not present

## 2023-07-25 DIAGNOSIS — K219 Gastro-esophageal reflux disease without esophagitis: Secondary | ICD-10-CM | POA: Diagnosis not present

## 2023-07-26 ENCOUNTER — Telehealth: Payer: Self-pay

## 2023-07-26 DIAGNOSIS — Z791 Long term (current) use of non-steroidal anti-inflammatories (NSAID): Secondary | ICD-10-CM | POA: Diagnosis not present

## 2023-07-26 DIAGNOSIS — K219 Gastro-esophageal reflux disease without esophagitis: Secondary | ICD-10-CM | POA: Diagnosis not present

## 2023-07-26 DIAGNOSIS — H9193 Unspecified hearing loss, bilateral: Secondary | ICD-10-CM | POA: Diagnosis not present

## 2023-07-26 DIAGNOSIS — F4310 Post-traumatic stress disorder, unspecified: Secondary | ICD-10-CM | POA: Diagnosis not present

## 2023-07-26 DIAGNOSIS — F32A Depression, unspecified: Secondary | ICD-10-CM | POA: Diagnosis not present

## 2023-07-26 DIAGNOSIS — Z79899 Other long term (current) drug therapy: Secondary | ICD-10-CM | POA: Diagnosis not present

## 2023-07-26 DIAGNOSIS — R000 Tachycardia, unspecified: Secondary | ICD-10-CM | POA: Diagnosis not present

## 2023-07-26 DIAGNOSIS — T81328D Disruption or dehiscence of closure of other specified internal operation (surgical) wound, subsequent encounter: Secondary | ICD-10-CM | POA: Diagnosis not present

## 2023-07-26 DIAGNOSIS — Z9889 Other specified postprocedural states: Secondary | ICD-10-CM | POA: Diagnosis not present

## 2023-07-26 DIAGNOSIS — C519 Malignant neoplasm of vulva, unspecified: Secondary | ICD-10-CM | POA: Diagnosis not present

## 2023-07-26 DIAGNOSIS — G893 Neoplasm related pain (acute) (chronic): Secondary | ICD-10-CM | POA: Diagnosis not present

## 2023-07-26 DIAGNOSIS — Z466 Encounter for fitting and adjustment of urinary device: Secondary | ICD-10-CM | POA: Diagnosis not present

## 2023-07-26 NOTE — Telephone Encounter (Signed)
 Plan of Care forms received placed in provider to be signed folder

## 2023-07-31 ENCOUNTER — Telehealth: Payer: Self-pay

## 2023-07-31 NOTE — Telephone Encounter (Signed)
 Received review report from [facility]. Placed in folder

## 2023-08-05 ENCOUNTER — Telehealth: Payer: Self-pay

## 2023-08-05 NOTE — Telephone Encounter (Signed)
 [facility] orders received and placed in provider to be signed folder

## 2023-08-05 NOTE — Telephone Encounter (Signed)
 2 [facility] orders received and placed in provider to be signed folder

## 2023-08-06 DIAGNOSIS — L598 Other specified disorders of the skin and subcutaneous tissue related to radiation: Secondary | ICD-10-CM | POA: Diagnosis not present

## 2023-08-06 DIAGNOSIS — Z923 Personal history of irradiation: Secondary | ICD-10-CM | POA: Diagnosis not present

## 2023-08-06 DIAGNOSIS — Z9221 Personal history of antineoplastic chemotherapy: Secondary | ICD-10-CM | POA: Diagnosis not present

## 2023-08-06 DIAGNOSIS — C519 Malignant neoplasm of vulva, unspecified: Secondary | ICD-10-CM | POA: Diagnosis not present

## 2023-08-06 DIAGNOSIS — Y842 Radiological procedure and radiotherapy as the cause of abnormal reaction of the patient, or of later complication, without mention of misadventure at the time of the procedure: Secondary | ICD-10-CM | POA: Diagnosis not present

## 2023-08-06 DIAGNOSIS — Z483 Aftercare following surgery for neoplasm: Secondary | ICD-10-CM | POA: Diagnosis not present

## 2023-08-06 DIAGNOSIS — Z9079 Acquired absence of other genital organ(s): Secondary | ICD-10-CM | POA: Diagnosis not present

## 2023-08-06 DIAGNOSIS — F4310 Post-traumatic stress disorder, unspecified: Secondary | ICD-10-CM | POA: Diagnosis not present

## 2023-08-06 DIAGNOSIS — Z7189 Other specified counseling: Secondary | ICD-10-CM | POA: Diagnosis not present

## 2023-08-06 NOTE — Telephone Encounter (Signed)
 Forms faxed to [phone] with a completed transmission log

## 2023-08-06 NOTE — Telephone Encounter (Signed)
 Form faxed to [phone] with a completed transmission log

## 2023-08-07 DIAGNOSIS — F4310 Post-traumatic stress disorder, unspecified: Secondary | ICD-10-CM | POA: Diagnosis not present

## 2023-08-07 DIAGNOSIS — K219 Gastro-esophageal reflux disease without esophagitis: Secondary | ICD-10-CM | POA: Diagnosis not present

## 2023-08-07 DIAGNOSIS — G893 Neoplasm related pain (acute) (chronic): Secondary | ICD-10-CM | POA: Diagnosis not present

## 2023-08-07 DIAGNOSIS — R000 Tachycardia, unspecified: Secondary | ICD-10-CM | POA: Diagnosis not present

## 2023-08-07 DIAGNOSIS — Z791 Long term (current) use of non-steroidal anti-inflammatories (NSAID): Secondary | ICD-10-CM | POA: Diagnosis not present

## 2023-08-07 DIAGNOSIS — T81328D Disruption or dehiscence of closure of other specified internal operation (surgical) wound, subsequent encounter: Secondary | ICD-10-CM | POA: Diagnosis not present

## 2023-08-07 DIAGNOSIS — C519 Malignant neoplasm of vulva, unspecified: Secondary | ICD-10-CM | POA: Diagnosis not present

## 2023-08-07 DIAGNOSIS — Z79899 Other long term (current) drug therapy: Secondary | ICD-10-CM | POA: Diagnosis not present

## 2023-08-07 DIAGNOSIS — F32A Depression, unspecified: Secondary | ICD-10-CM | POA: Diagnosis not present

## 2023-08-07 DIAGNOSIS — H9193 Unspecified hearing loss, bilateral: Secondary | ICD-10-CM | POA: Diagnosis not present

## 2023-08-07 DIAGNOSIS — Z466 Encounter for fitting and adjustment of urinary device: Secondary | ICD-10-CM | POA: Diagnosis not present

## 2023-08-08 ENCOUNTER — Telehealth: Payer: Self-pay

## 2023-08-08 DIAGNOSIS — F4310 Post-traumatic stress disorder, unspecified: Secondary | ICD-10-CM | POA: Diagnosis not present

## 2023-08-08 NOTE — Telephone Encounter (Signed)
 [facility] ORDER PLACED IN PROVIDER TO BE SIGNED FOLDER

## 2023-08-13 DIAGNOSIS — L03818 Cellulitis of other sites: Secondary | ICD-10-CM | POA: Diagnosis not present

## 2023-08-13 DIAGNOSIS — F32A Depression, unspecified: Secondary | ICD-10-CM | POA: Diagnosis not present

## 2023-08-13 DIAGNOSIS — H9193 Unspecified hearing loss, bilateral: Secondary | ICD-10-CM | POA: Diagnosis not present

## 2023-08-13 DIAGNOSIS — C519 Malignant neoplasm of vulva, unspecified: Secondary | ICD-10-CM | POA: Diagnosis not present

## 2023-08-13 DIAGNOSIS — G893 Neoplasm related pain (acute) (chronic): Secondary | ICD-10-CM | POA: Diagnosis not present

## 2023-08-13 DIAGNOSIS — Z466 Encounter for fitting and adjustment of urinary device: Secondary | ICD-10-CM | POA: Diagnosis not present

## 2023-08-13 DIAGNOSIS — K219 Gastro-esophageal reflux disease without esophagitis: Secondary | ICD-10-CM | POA: Diagnosis not present

## 2023-08-13 DIAGNOSIS — Z791 Long term (current) use of non-steroidal anti-inflammatories (NSAID): Secondary | ICD-10-CM | POA: Diagnosis not present

## 2023-08-13 DIAGNOSIS — T81328D Disruption or dehiscence of closure of other specified internal operation (surgical) wound, subsequent encounter: Secondary | ICD-10-CM | POA: Diagnosis not present

## 2023-08-13 DIAGNOSIS — R000 Tachycardia, unspecified: Secondary | ICD-10-CM | POA: Diagnosis not present

## 2023-08-13 DIAGNOSIS — Z79899 Other long term (current) drug therapy: Secondary | ICD-10-CM | POA: Diagnosis not present

## 2023-08-13 DIAGNOSIS — F4310 Post-traumatic stress disorder, unspecified: Secondary | ICD-10-CM | POA: Diagnosis not present

## 2023-08-13 NOTE — Telephone Encounter (Signed)
 Form faxed to [phone] with a completed transmission log

## 2023-08-14 DIAGNOSIS — Z8544 Personal history of malignant neoplasm of other female genital organs: Secondary | ICD-10-CM | POA: Diagnosis not present

## 2023-08-14 DIAGNOSIS — L03818 Cellulitis of other sites: Secondary | ICD-10-CM | POA: Diagnosis not present

## 2023-08-14 DIAGNOSIS — K219 Gastro-esophageal reflux disease without esophagitis: Secondary | ICD-10-CM | POA: Diagnosis not present

## 2023-08-14 DIAGNOSIS — L0390 Cellulitis, unspecified: Secondary | ICD-10-CM | POA: Diagnosis not present

## 2023-08-14 DIAGNOSIS — Z79899 Other long term (current) drug therapy: Secondary | ICD-10-CM | POA: Diagnosis not present

## 2023-08-14 DIAGNOSIS — T8149XA Infection following a procedure, other surgical site, initial encounter: Secondary | ICD-10-CM | POA: Diagnosis not present

## 2023-08-14 DIAGNOSIS — Z08 Encounter for follow-up examination after completed treatment for malignant neoplasm: Secondary | ICD-10-CM | POA: Diagnosis not present

## 2023-08-14 DIAGNOSIS — Y842 Radiological procedure and radiotherapy as the cause of abnormal reaction of the patient, or of later complication, without mention of misadventure at the time of the procedure: Secondary | ICD-10-CM | POA: Diagnosis not present

## 2023-08-14 DIAGNOSIS — T8130XA Disruption of wound, unspecified, initial encounter: Secondary | ICD-10-CM | POA: Diagnosis not present

## 2023-08-14 DIAGNOSIS — L03311 Cellulitis of abdominal wall: Secondary | ICD-10-CM | POA: Diagnosis not present

## 2023-08-14 DIAGNOSIS — Z881 Allergy status to other antibiotic agents status: Secondary | ICD-10-CM | POA: Diagnosis not present

## 2023-08-14 DIAGNOSIS — Z923 Personal history of irradiation: Secondary | ICD-10-CM | POA: Diagnosis not present

## 2023-08-14 DIAGNOSIS — L598 Other specified disorders of the skin and subcutaneous tissue related to radiation: Secondary | ICD-10-CM | POA: Diagnosis not present

## 2023-08-14 DIAGNOSIS — Z91048 Other nonmedicinal substance allergy status: Secondary | ICD-10-CM | POA: Diagnosis not present

## 2023-08-14 DIAGNOSIS — Z9221 Personal history of antineoplastic chemotherapy: Secondary | ICD-10-CM | POA: Diagnosis not present

## 2023-08-14 DIAGNOSIS — Z880 Allergy status to penicillin: Secondary | ICD-10-CM | POA: Diagnosis not present

## 2023-08-19 DIAGNOSIS — H9193 Unspecified hearing loss, bilateral: Secondary | ICD-10-CM | POA: Diagnosis not present

## 2023-08-19 DIAGNOSIS — K219 Gastro-esophageal reflux disease without esophagitis: Secondary | ICD-10-CM | POA: Diagnosis not present

## 2023-08-19 DIAGNOSIS — F32A Depression, unspecified: Secondary | ICD-10-CM | POA: Diagnosis not present

## 2023-08-19 DIAGNOSIS — G893 Neoplasm related pain (acute) (chronic): Secondary | ICD-10-CM | POA: Diagnosis not present

## 2023-08-19 DIAGNOSIS — C519 Malignant neoplasm of vulva, unspecified: Secondary | ICD-10-CM | POA: Diagnosis not present

## 2023-08-19 DIAGNOSIS — Z79899 Other long term (current) drug therapy: Secondary | ICD-10-CM | POA: Diagnosis not present

## 2023-08-19 DIAGNOSIS — T81328D Disruption or dehiscence of closure of other specified internal operation (surgical) wound, subsequent encounter: Secondary | ICD-10-CM | POA: Diagnosis not present

## 2023-08-19 DIAGNOSIS — F4310 Post-traumatic stress disorder, unspecified: Secondary | ICD-10-CM | POA: Diagnosis not present

## 2023-08-19 DIAGNOSIS — Z791 Long term (current) use of non-steroidal anti-inflammatories (NSAID): Secondary | ICD-10-CM | POA: Diagnosis not present

## 2023-08-19 DIAGNOSIS — Z466 Encounter for fitting and adjustment of urinary device: Secondary | ICD-10-CM | POA: Diagnosis not present

## 2023-08-19 DIAGNOSIS — R000 Tachycardia, unspecified: Secondary | ICD-10-CM | POA: Diagnosis not present

## 2023-08-20 DIAGNOSIS — F321 Major depressive disorder, single episode, moderate: Secondary | ICD-10-CM | POA: Diagnosis not present

## 2023-08-20 DIAGNOSIS — F411 Generalized anxiety disorder: Secondary | ICD-10-CM | POA: Diagnosis not present

## 2023-08-20 DIAGNOSIS — F4311 Post-traumatic stress disorder, acute: Secondary | ICD-10-CM | POA: Diagnosis not present

## 2023-08-20 DIAGNOSIS — G4701 Insomnia due to medical condition: Secondary | ICD-10-CM | POA: Diagnosis not present

## 2023-08-21 ENCOUNTER — Telehealth: Payer: Self-pay

## 2023-08-21 NOTE — Telephone Encounter (Signed)
 [facility] forms placed in provider to be signed folder

## 2023-08-22 DIAGNOSIS — H9193 Unspecified hearing loss, bilateral: Secondary | ICD-10-CM | POA: Diagnosis not present

## 2023-08-22 DIAGNOSIS — Z466 Encounter for fitting and adjustment of urinary device: Secondary | ICD-10-CM | POA: Diagnosis not present

## 2023-08-22 DIAGNOSIS — K219 Gastro-esophageal reflux disease without esophagitis: Secondary | ICD-10-CM | POA: Diagnosis not present

## 2023-08-22 DIAGNOSIS — G893 Neoplasm related pain (acute) (chronic): Secondary | ICD-10-CM | POA: Diagnosis not present

## 2023-08-22 DIAGNOSIS — R000 Tachycardia, unspecified: Secondary | ICD-10-CM | POA: Diagnosis not present

## 2023-08-22 DIAGNOSIS — T81328D Disruption or dehiscence of closure of other specified internal operation (surgical) wound, subsequent encounter: Secondary | ICD-10-CM | POA: Diagnosis not present

## 2023-08-22 DIAGNOSIS — Z791 Long term (current) use of non-steroidal anti-inflammatories (NSAID): Secondary | ICD-10-CM | POA: Diagnosis not present

## 2023-08-22 DIAGNOSIS — F4310 Post-traumatic stress disorder, unspecified: Secondary | ICD-10-CM | POA: Diagnosis not present

## 2023-08-22 DIAGNOSIS — F32A Depression, unspecified: Secondary | ICD-10-CM | POA: Diagnosis not present

## 2023-08-22 DIAGNOSIS — C519 Malignant neoplasm of vulva, unspecified: Secondary | ICD-10-CM | POA: Diagnosis not present

## 2023-08-22 DIAGNOSIS — Z79899 Other long term (current) drug therapy: Secondary | ICD-10-CM | POA: Diagnosis not present

## 2023-08-23 ENCOUNTER — Telehealth: Payer: Self-pay

## 2023-08-23 DIAGNOSIS — Z9889 Other specified postprocedural states: Secondary | ICD-10-CM | POA: Diagnosis not present

## 2023-08-23 DIAGNOSIS — C519 Malignant neoplasm of vulva, unspecified: Secondary | ICD-10-CM | POA: Diagnosis not present

## 2023-08-23 NOTE — Telephone Encounter (Signed)
 Copied from CRM [id number]. Topic: Clinical - Home Health Verbal Orders >> Aug 23, 2023 12:31 PM Laymon HERO wrote: Caller/Agency: [facility] Callback Number: [phone] Service Requested: Occupational Therapy Frequency: 1 week 2  Any new concerns about the patient? No

## 2023-08-23 NOTE — Telephone Encounter (Signed)
 Detailed vm left giving the verbal orders for OT

## 2023-08-23 NOTE — Telephone Encounter (Signed)
 Faxed to [phone] with a  completed transmission log

## 2023-08-26 DIAGNOSIS — K219 Gastro-esophageal reflux disease without esophagitis: Secondary | ICD-10-CM | POA: Diagnosis not present

## 2023-08-26 DIAGNOSIS — R000 Tachycardia, unspecified: Secondary | ICD-10-CM | POA: Diagnosis not present

## 2023-08-26 DIAGNOSIS — T81328D Disruption or dehiscence of closure of other specified internal operation (surgical) wound, subsequent encounter: Secondary | ICD-10-CM | POA: Diagnosis not present

## 2023-08-26 DIAGNOSIS — Z79899 Other long term (current) drug therapy: Secondary | ICD-10-CM | POA: Diagnosis not present

## 2023-08-26 DIAGNOSIS — H9193 Unspecified hearing loss, bilateral: Secondary | ICD-10-CM | POA: Diagnosis not present

## 2023-08-26 DIAGNOSIS — G893 Neoplasm related pain (acute) (chronic): Secondary | ICD-10-CM | POA: Diagnosis not present

## 2023-08-26 DIAGNOSIS — C519 Malignant neoplasm of vulva, unspecified: Secondary | ICD-10-CM | POA: Diagnosis not present

## 2023-08-26 DIAGNOSIS — Z515 Encounter for palliative care: Secondary | ICD-10-CM | POA: Diagnosis not present

## 2023-08-26 DIAGNOSIS — R110 Nausea: Secondary | ICD-10-CM | POA: Diagnosis not present

## 2023-08-26 DIAGNOSIS — Z791 Long term (current) use of non-steroidal anti-inflammatories (NSAID): Secondary | ICD-10-CM | POA: Diagnosis not present

## 2023-08-26 DIAGNOSIS — F32A Depression, unspecified: Secondary | ICD-10-CM | POA: Diagnosis not present

## 2023-08-26 DIAGNOSIS — F4310 Post-traumatic stress disorder, unspecified: Secondary | ICD-10-CM | POA: Diagnosis not present

## 2023-08-26 DIAGNOSIS — Z466 Encounter for fitting and adjustment of urinary device: Secondary | ICD-10-CM | POA: Diagnosis not present

## 2023-08-27 DIAGNOSIS — C519 Malignant neoplasm of vulva, unspecified: Secondary | ICD-10-CM | POA: Diagnosis not present

## 2023-08-27 DIAGNOSIS — F4310 Post-traumatic stress disorder, unspecified: Secondary | ICD-10-CM | POA: Diagnosis not present

## 2023-08-27 DIAGNOSIS — L0390 Cellulitis, unspecified: Secondary | ICD-10-CM | POA: Diagnosis not present

## 2023-08-27 DIAGNOSIS — R590 Localized enlarged lymph nodes: Secondary | ICD-10-CM | POA: Diagnosis not present

## 2023-08-27 DIAGNOSIS — Z9079 Acquired absence of other genital organ(s): Secondary | ICD-10-CM | POA: Diagnosis not present

## 2023-08-27 DIAGNOSIS — D509 Iron deficiency anemia, unspecified: Secondary | ICD-10-CM | POA: Diagnosis not present

## 2023-08-27 DIAGNOSIS — Z9221 Personal history of antineoplastic chemotherapy: Secondary | ICD-10-CM | POA: Diagnosis not present

## 2023-08-27 DIAGNOSIS — Z7189 Other specified counseling: Secondary | ICD-10-CM | POA: Diagnosis not present

## 2023-08-27 DIAGNOSIS — T148XXD Other injury of unspecified body region, subsequent encounter: Secondary | ICD-10-CM | POA: Diagnosis not present

## 2023-08-27 DIAGNOSIS — H9190 Unspecified hearing loss, unspecified ear: Secondary | ICD-10-CM | POA: Diagnosis not present

## 2023-08-27 DIAGNOSIS — Z923 Personal history of irradiation: Secondary | ICD-10-CM | POA: Diagnosis not present

## 2023-08-27 DIAGNOSIS — D649 Anemia, unspecified: Secondary | ICD-10-CM | POA: Diagnosis not present

## 2023-08-28 DIAGNOSIS — K219 Gastro-esophageal reflux disease without esophagitis: Secondary | ICD-10-CM | POA: Diagnosis not present

## 2023-08-28 DIAGNOSIS — Z466 Encounter for fitting and adjustment of urinary device: Secondary | ICD-10-CM | POA: Diagnosis not present

## 2023-08-28 DIAGNOSIS — G893 Neoplasm related pain (acute) (chronic): Secondary | ICD-10-CM | POA: Diagnosis not present

## 2023-08-28 DIAGNOSIS — Z79899 Other long term (current) drug therapy: Secondary | ICD-10-CM | POA: Diagnosis not present

## 2023-08-28 DIAGNOSIS — C519 Malignant neoplasm of vulva, unspecified: Secondary | ICD-10-CM | POA: Diagnosis not present

## 2023-08-28 DIAGNOSIS — F4310 Post-traumatic stress disorder, unspecified: Secondary | ICD-10-CM | POA: Diagnosis not present

## 2023-08-28 DIAGNOSIS — H9193 Unspecified hearing loss, bilateral: Secondary | ICD-10-CM | POA: Diagnosis not present

## 2023-08-28 DIAGNOSIS — F32A Depression, unspecified: Secondary | ICD-10-CM | POA: Diagnosis not present

## 2023-08-28 DIAGNOSIS — R000 Tachycardia, unspecified: Secondary | ICD-10-CM | POA: Diagnosis not present

## 2023-08-28 DIAGNOSIS — Z791 Long term (current) use of non-steroidal anti-inflammatories (NSAID): Secondary | ICD-10-CM | POA: Diagnosis not present

## 2023-08-28 DIAGNOSIS — T81328D Disruption or dehiscence of closure of other specified internal operation (surgical) wound, subsequent encounter: Secondary | ICD-10-CM | POA: Diagnosis not present

## 2023-08-29 DIAGNOSIS — H918X3 Other specified hearing loss, bilateral: Secondary | ICD-10-CM | POA: Diagnosis not present

## 2023-08-29 DIAGNOSIS — G6289 Other specified polyneuropathies: Secondary | ICD-10-CM | POA: Diagnosis not present

## 2023-08-29 DIAGNOSIS — C518 Malignant neoplasm of overlapping sites of vulva: Secondary | ICD-10-CM | POA: Diagnosis not present

## 2023-08-29 DIAGNOSIS — Z880 Allergy status to penicillin: Secondary | ICD-10-CM | POA: Diagnosis not present

## 2023-08-29 DIAGNOSIS — Z6831 Body mass index (BMI) 31.0-31.9, adult: Secondary | ICD-10-CM | POA: Diagnosis not present

## 2023-08-29 DIAGNOSIS — Z923 Personal history of irradiation: Secondary | ICD-10-CM | POA: Diagnosis not present

## 2023-08-29 DIAGNOSIS — Z8544 Personal history of malignant neoplasm of other female genital organs: Secondary | ICD-10-CM | POA: Diagnosis not present

## 2023-08-29 DIAGNOSIS — C158 Malignant neoplasm of overlapping sites of esophagus: Secondary | ICD-10-CM | POA: Diagnosis not present

## 2023-08-29 DIAGNOSIS — I517 Cardiomegaly: Secondary | ICD-10-CM | POA: Diagnosis not present

## 2023-08-29 DIAGNOSIS — F1190 Opioid use, unspecified, uncomplicated: Secondary | ICD-10-CM | POA: Diagnosis not present

## 2023-08-29 DIAGNOSIS — Z881 Allergy status to other antibiotic agents status: Secondary | ICD-10-CM | POA: Diagnosis not present

## 2023-08-29 DIAGNOSIS — Z8616 Personal history of COVID-19: Secondary | ICD-10-CM | POA: Diagnosis not present

## 2023-08-29 DIAGNOSIS — Z9221 Personal history of antineoplastic chemotherapy: Secondary | ICD-10-CM | POA: Diagnosis not present

## 2023-08-29 DIAGNOSIS — Z9889 Other specified postprocedural states: Secondary | ICD-10-CM | POA: Diagnosis not present

## 2023-08-29 DIAGNOSIS — C519 Malignant neoplasm of vulva, unspecified: Secondary | ICD-10-CM | POA: Diagnosis not present

## 2023-08-29 DIAGNOSIS — K219 Gastro-esophageal reflux disease without esophagitis: Secondary | ICD-10-CM | POA: Diagnosis not present

## 2023-08-29 DIAGNOSIS — G8929 Other chronic pain: Secondary | ICD-10-CM | POA: Diagnosis not present

## 2023-08-29 DIAGNOSIS — Z9079 Acquired absence of other genital organ(s): Secondary | ICD-10-CM | POA: Diagnosis not present

## 2023-08-29 DIAGNOSIS — R000 Tachycardia, unspecified: Secondary | ICD-10-CM | POA: Diagnosis not present

## 2023-08-29 DIAGNOSIS — D649 Anemia, unspecified: Secondary | ICD-10-CM | POA: Diagnosis not present

## 2023-08-29 DIAGNOSIS — Z79899 Other long term (current) drug therapy: Secondary | ICD-10-CM | POA: Diagnosis not present

## 2023-08-29 DIAGNOSIS — I456 Pre-excitation syndrome: Secondary | ICD-10-CM | POA: Diagnosis not present

## 2023-08-29 DIAGNOSIS — E669 Obesity, unspecified: Secondary | ICD-10-CM | POA: Diagnosis not present

## 2023-09-04 ENCOUNTER — Telehealth: Payer: Self-pay

## 2023-09-04 DIAGNOSIS — C519 Malignant neoplasm of vulva, unspecified: Secondary | ICD-10-CM | POA: Diagnosis not present

## 2023-09-04 DIAGNOSIS — R000 Tachycardia, unspecified: Secondary | ICD-10-CM | POA: Diagnosis not present

## 2023-09-04 DIAGNOSIS — Z79899 Other long term (current) drug therapy: Secondary | ICD-10-CM | POA: Diagnosis not present

## 2023-09-04 DIAGNOSIS — G893 Neoplasm related pain (acute) (chronic): Secondary | ICD-10-CM | POA: Diagnosis not present

## 2023-09-04 DIAGNOSIS — H9193 Unspecified hearing loss, bilateral: Secondary | ICD-10-CM | POA: Diagnosis not present

## 2023-09-04 DIAGNOSIS — F4310 Post-traumatic stress disorder, unspecified: Secondary | ICD-10-CM | POA: Diagnosis not present

## 2023-09-04 DIAGNOSIS — T81328D Disruption or dehiscence of closure of other specified internal operation (surgical) wound, subsequent encounter: Secondary | ICD-10-CM | POA: Diagnosis not present

## 2023-09-04 DIAGNOSIS — K219 Gastro-esophageal reflux disease without esophagitis: Secondary | ICD-10-CM | POA: Diagnosis not present

## 2023-09-04 DIAGNOSIS — F32A Depression, unspecified: Secondary | ICD-10-CM | POA: Diagnosis not present

## 2023-09-04 DIAGNOSIS — Z466 Encounter for fitting and adjustment of urinary device: Secondary | ICD-10-CM | POA: Diagnosis not present

## 2023-09-04 DIAGNOSIS — Z791 Long term (current) use of non-steroidal anti-inflammatories (NSAID): Secondary | ICD-10-CM | POA: Diagnosis not present

## 2023-09-04 NOTE — Telephone Encounter (Signed)
 [facility] orders placed in provider to be signed folder

## 2023-09-09 NOTE — Telephone Encounter (Signed)
 FAXED TO GIVEN FAX NUMBER WITH A COMPLETED TRANSMISSION LOG

## 2023-09-17 DIAGNOSIS — G893 Neoplasm related pain (acute) (chronic): Secondary | ICD-10-CM | POA: Diagnosis not present

## 2023-09-17 DIAGNOSIS — K5903 Drug induced constipation: Secondary | ICD-10-CM | POA: Diagnosis not present

## 2023-09-17 DIAGNOSIS — Z515 Encounter for palliative care: Secondary | ICD-10-CM | POA: Diagnosis not present

## 2023-09-17 DIAGNOSIS — K5900 Constipation, unspecified: Secondary | ICD-10-CM | POA: Diagnosis not present

## 2023-09-17 DIAGNOSIS — R7989 Other specified abnormal findings of blood chemistry: Secondary | ICD-10-CM | POA: Diagnosis not present

## 2023-09-17 DIAGNOSIS — C519 Malignant neoplasm of vulva, unspecified: Secondary | ICD-10-CM | POA: Diagnosis not present

## 2023-09-17 DIAGNOSIS — D649 Anemia, unspecified: Secondary | ICD-10-CM | POA: Diagnosis not present

## 2023-09-20 DIAGNOSIS — C519 Malignant neoplasm of vulva, unspecified: Secondary | ICD-10-CM | POA: Diagnosis not present

## 2023-09-20 DIAGNOSIS — Z79631 Long term (current) use of antimetabolite agent: Secondary | ICD-10-CM | POA: Diagnosis not present

## 2023-09-20 DIAGNOSIS — Z5112 Encounter for antineoplastic immunotherapy: Secondary | ICD-10-CM | POA: Diagnosis not present

## 2023-09-20 DIAGNOSIS — Z5111 Encounter for antineoplastic chemotherapy: Secondary | ICD-10-CM | POA: Diagnosis not present

## 2023-09-20 DIAGNOSIS — Z79899 Other long term (current) drug therapy: Secondary | ICD-10-CM | POA: Diagnosis not present

## 2023-09-24 DIAGNOSIS — Z79899 Other long term (current) drug therapy: Secondary | ICD-10-CM | POA: Diagnosis not present

## 2023-09-24 DIAGNOSIS — D649 Anemia, unspecified: Secondary | ICD-10-CM | POA: Diagnosis not present

## 2023-09-24 DIAGNOSIS — D509 Iron deficiency anemia, unspecified: Secondary | ICD-10-CM | POA: Diagnosis not present

## 2023-09-24 DIAGNOSIS — R590 Localized enlarged lymph nodes: Secondary | ICD-10-CM | POA: Diagnosis not present

## 2023-09-24 DIAGNOSIS — T8131XD Disruption of external operation (surgical) wound, not elsewhere classified, subsequent encounter: Secondary | ICD-10-CM | POA: Diagnosis not present

## 2023-09-24 DIAGNOSIS — F4310 Post-traumatic stress disorder, unspecified: Secondary | ICD-10-CM | POA: Diagnosis not present

## 2023-09-24 DIAGNOSIS — Z923 Personal history of irradiation: Secondary | ICD-10-CM | POA: Diagnosis not present

## 2023-09-24 DIAGNOSIS — G893 Neoplasm related pain (acute) (chronic): Secondary | ICD-10-CM | POA: Diagnosis not present

## 2023-09-24 DIAGNOSIS — C519 Malignant neoplasm of vulva, unspecified: Secondary | ICD-10-CM | POA: Diagnosis not present

## 2023-09-24 DIAGNOSIS — Z9221 Personal history of antineoplastic chemotherapy: Secondary | ICD-10-CM | POA: Diagnosis not present

## 2023-10-03 DIAGNOSIS — Z6829 Body mass index (BMI) 29.0-29.9, adult: Secondary | ICD-10-CM | POA: Diagnosis not present

## 2023-10-03 DIAGNOSIS — F4323 Adjustment disorder with mixed anxiety and depressed mood: Secondary | ICD-10-CM | POA: Diagnosis not present

## 2023-10-03 DIAGNOSIS — Z713 Dietary counseling and surveillance: Secondary | ICD-10-CM | POA: Diagnosis not present

## 2023-10-07 DIAGNOSIS — C519 Malignant neoplasm of vulva, unspecified: Secondary | ICD-10-CM | POA: Diagnosis not present

## 2023-10-10 ENCOUNTER — Telehealth: Payer: Self-pay

## 2023-10-10 DIAGNOSIS — F4323 Adjustment disorder with mixed anxiety and depressed mood: Secondary | ICD-10-CM | POA: Diagnosis not present

## 2023-10-10 NOTE — Telephone Encounter (Signed)
 Copied from CRM [id number]. Topic: Medical Record Request - Records Request >> Oct 10, 2023  9:07 AM Montie POUR wrote: Reason for CRM:  Lamar with [facility] will fax over authorization to receive medical records for Misty Lowe. He will request office notes from Ms. Yazzie last 2-3 office visits.

## 2023-10-14 DIAGNOSIS — C519 Malignant neoplasm of vulva, unspecified: Secondary | ICD-10-CM | POA: Diagnosis not present

## 2023-10-14 DIAGNOSIS — Z9889 Other specified postprocedural states: Secondary | ICD-10-CM | POA: Diagnosis not present

## 2023-10-14 DIAGNOSIS — R102 Pelvic and perineal pain: Secondary | ICD-10-CM | POA: Diagnosis not present

## 2023-10-14 DIAGNOSIS — K625 Hemorrhage of anus and rectum: Secondary | ICD-10-CM | POA: Diagnosis not present

## 2023-10-14 DIAGNOSIS — R5383 Other fatigue: Secondary | ICD-10-CM | POA: Diagnosis not present

## 2023-10-14 DIAGNOSIS — R339 Retention of urine, unspecified: Secondary | ICD-10-CM | POA: Diagnosis not present

## 2023-10-14 DIAGNOSIS — S3140XA Unspecified open wound of vagina and vulva, initial encounter: Secondary | ICD-10-CM | POA: Diagnosis not present

## 2023-10-14 DIAGNOSIS — G893 Neoplasm related pain (acute) (chronic): Secondary | ICD-10-CM | POA: Diagnosis not present

## 2023-10-15 DIAGNOSIS — Z79633 Long term (current) use of mitotic inhibitor: Secondary | ICD-10-CM | POA: Diagnosis not present

## 2023-10-15 DIAGNOSIS — Z9079 Acquired absence of other genital organ(s): Secondary | ICD-10-CM | POA: Diagnosis not present

## 2023-10-15 DIAGNOSIS — G893 Neoplasm related pain (acute) (chronic): Secondary | ICD-10-CM | POA: Diagnosis not present

## 2023-10-15 DIAGNOSIS — C519 Malignant neoplasm of vulva, unspecified: Secondary | ICD-10-CM | POA: Diagnosis not present

## 2023-10-15 DIAGNOSIS — F4310 Post-traumatic stress disorder, unspecified: Secondary | ICD-10-CM | POA: Diagnosis not present

## 2023-10-15 DIAGNOSIS — Z5112 Encounter for antineoplastic immunotherapy: Secondary | ICD-10-CM | POA: Diagnosis not present

## 2023-10-15 DIAGNOSIS — H9190 Unspecified hearing loss, unspecified ear: Secondary | ICD-10-CM | POA: Diagnosis not present

## 2023-10-15 DIAGNOSIS — Z5111 Encounter for antineoplastic chemotherapy: Secondary | ICD-10-CM | POA: Diagnosis not present

## 2023-10-15 DIAGNOSIS — Z79899 Other long term (current) drug therapy: Secondary | ICD-10-CM | POA: Diagnosis not present

## 2023-10-15 DIAGNOSIS — Z923 Personal history of irradiation: Secondary | ICD-10-CM | POA: Diagnosis not present

## 2023-10-15 DIAGNOSIS — Z9221 Personal history of antineoplastic chemotherapy: Secondary | ICD-10-CM | POA: Diagnosis not present

## 2023-10-15 DIAGNOSIS — Z79620 Long term (current) use of immunosuppressive biologic: Secondary | ICD-10-CM | POA: Diagnosis not present

## 2023-10-17 DIAGNOSIS — C774 Secondary and unspecified malignant neoplasm of inguinal and lower limb lymph nodes: Secondary | ICD-10-CM | POA: Diagnosis not present

## 2023-10-17 DIAGNOSIS — N7689 Other specified inflammation of vagina and vulva: Secondary | ICD-10-CM | POA: Diagnosis not present

## 2023-10-17 DIAGNOSIS — F4323 Adjustment disorder with mixed anxiety and depressed mood: Secondary | ICD-10-CM | POA: Diagnosis not present

## 2023-10-17 DIAGNOSIS — K5903 Drug induced constipation: Secondary | ICD-10-CM | POA: Diagnosis not present

## 2023-10-17 DIAGNOSIS — C519 Malignant neoplasm of vulva, unspecified: Secondary | ICD-10-CM | POA: Diagnosis not present

## 2023-10-17 DIAGNOSIS — Z515 Encounter for palliative care: Secondary | ICD-10-CM | POA: Diagnosis not present

## 2023-10-17 DIAGNOSIS — G893 Neoplasm related pain (acute) (chronic): Secondary | ICD-10-CM | POA: Diagnosis not present

## 2023-10-22 DIAGNOSIS — C519 Malignant neoplasm of vulva, unspecified: Secondary | ICD-10-CM | POA: Diagnosis not present

## 2023-10-22 DIAGNOSIS — Z6829 Body mass index (BMI) 29.0-29.9, adult: Secondary | ICD-10-CM | POA: Diagnosis not present

## 2023-10-24 DIAGNOSIS — F4323 Adjustment disorder with mixed anxiety and depressed mood: Secondary | ICD-10-CM | POA: Diagnosis not present

## 2023-11-04 DIAGNOSIS — Z923 Personal history of irradiation: Secondary | ICD-10-CM | POA: Diagnosis not present

## 2023-11-04 DIAGNOSIS — T451X5A Adverse effect of antineoplastic and immunosuppressive drugs, initial encounter: Secondary | ICD-10-CM | POA: Diagnosis not present

## 2023-11-04 DIAGNOSIS — C779 Secondary and unspecified malignant neoplasm of lymph node, unspecified: Secondary | ICD-10-CM | POA: Diagnosis not present

## 2023-11-04 DIAGNOSIS — R404 Transient alteration of awareness: Secondary | ICD-10-CM | POA: Diagnosis not present

## 2023-11-04 DIAGNOSIS — Z0389 Encounter for observation for other suspected diseases and conditions ruled out: Secondary | ICD-10-CM | POA: Diagnosis not present

## 2023-11-04 DIAGNOSIS — Z7901 Long term (current) use of anticoagulants: Secondary | ICD-10-CM | POA: Diagnosis not present

## 2023-11-04 DIAGNOSIS — R102 Pelvic and perineal pain unspecified side: Secondary | ICD-10-CM | POA: Diagnosis not present

## 2023-11-04 DIAGNOSIS — R946 Abnormal results of thyroid function studies: Secondary | ICD-10-CM | POA: Diagnosis not present

## 2023-11-04 DIAGNOSIS — Z880 Allergy status to penicillin: Secondary | ICD-10-CM | POA: Diagnosis not present

## 2023-11-04 DIAGNOSIS — C519 Malignant neoplasm of vulva, unspecified: Secondary | ICD-10-CM | POA: Diagnosis not present

## 2023-11-04 DIAGNOSIS — I872 Venous insufficiency (chronic) (peripheral): Secondary | ICD-10-CM | POA: Diagnosis not present

## 2023-11-04 DIAGNOSIS — T402X5A Adverse effect of other opioids, initial encounter: Secondary | ICD-10-CM | POA: Diagnosis not present

## 2023-11-04 DIAGNOSIS — Z91048 Other nonmedicinal substance allergy status: Secondary | ICD-10-CM | POA: Diagnosis not present

## 2023-11-04 DIAGNOSIS — D649 Anemia, unspecified: Secondary | ICD-10-CM | POA: Diagnosis not present

## 2023-11-04 DIAGNOSIS — Z881 Allergy status to other antibiotic agents status: Secondary | ICD-10-CM | POA: Diagnosis not present

## 2023-11-04 DIAGNOSIS — E2740 Unspecified adrenocortical insufficiency: Secondary | ICD-10-CM | POA: Diagnosis not present

## 2023-11-04 DIAGNOSIS — Z515 Encounter for palliative care: Secondary | ICD-10-CM | POA: Diagnosis not present

## 2023-11-04 DIAGNOSIS — N390 Urinary tract infection, site not specified: Secondary | ICD-10-CM | POA: Diagnosis not present

## 2023-11-04 DIAGNOSIS — K5903 Drug induced constipation: Secondary | ICD-10-CM | POA: Diagnosis not present

## 2023-11-04 DIAGNOSIS — G893 Neoplasm related pain (acute) (chronic): Secondary | ICD-10-CM | POA: Diagnosis not present

## 2023-11-04 DIAGNOSIS — R442 Other hallucinations: Secondary | ICD-10-CM | POA: Diagnosis not present

## 2023-11-04 DIAGNOSIS — Z23 Encounter for immunization: Secondary | ICD-10-CM | POA: Diagnosis not present

## 2023-11-04 DIAGNOSIS — R4182 Altered mental status, unspecified: Secondary | ICD-10-CM | POA: Diagnosis not present

## 2023-11-04 DIAGNOSIS — R109 Unspecified abdominal pain: Secondary | ICD-10-CM | POA: Diagnosis not present

## 2023-11-04 DIAGNOSIS — Z79899 Other long term (current) drug therapy: Secondary | ICD-10-CM | POA: Diagnosis not present

## 2023-11-04 DIAGNOSIS — Z1152 Encounter for screening for COVID-19: Secondary | ICD-10-CM | POA: Diagnosis not present

## 2023-11-04 DIAGNOSIS — R7989 Other specified abnormal findings of blood chemistry: Secondary | ICD-10-CM | POA: Diagnosis not present

## 2023-11-04 DIAGNOSIS — I959 Hypotension, unspecified: Secondary | ICD-10-CM | POA: Diagnosis not present

## 2023-11-04 DIAGNOSIS — M7989 Other specified soft tissue disorders: Secondary | ICD-10-CM | POA: Diagnosis not present

## 2023-11-04 DIAGNOSIS — E039 Hypothyroidism, unspecified: Secondary | ICD-10-CM | POA: Diagnosis not present

## 2023-11-04 DIAGNOSIS — Z85828 Personal history of other malignant neoplasm of skin: Secondary | ICD-10-CM | POA: Diagnosis not present

## 2023-11-04 DIAGNOSIS — K449 Diaphragmatic hernia without obstruction or gangrene: Secondary | ICD-10-CM | POA: Diagnosis not present

## 2023-11-04 DIAGNOSIS — F4489 Other dissociative and conversion disorders: Secondary | ICD-10-CM | POA: Diagnosis not present

## 2023-11-04 DIAGNOSIS — D6481 Anemia due to antineoplastic chemotherapy: Secondary | ICD-10-CM | POA: Diagnosis not present

## 2023-11-04 DIAGNOSIS — E273 Drug-induced adrenocortical insufficiency: Secondary | ICD-10-CM | POA: Diagnosis not present

## 2023-11-05 DIAGNOSIS — F4489 Other dissociative and conversion disorders: Secondary | ICD-10-CM | POA: Diagnosis not present

## 2023-11-05 DIAGNOSIS — G893 Neoplasm related pain (acute) (chronic): Secondary | ICD-10-CM | POA: Diagnosis not present

## 2023-11-05 DIAGNOSIS — C519 Malignant neoplasm of vulva, unspecified: Secondary | ICD-10-CM | POA: Diagnosis not present

## 2023-11-05 DIAGNOSIS — R4182 Altered mental status, unspecified: Secondary | ICD-10-CM | POA: Diagnosis not present

## 2023-11-05 DIAGNOSIS — Z23 Encounter for immunization: Secondary | ICD-10-CM | POA: Diagnosis not present

## 2023-11-05 DIAGNOSIS — Z515 Encounter for palliative care: Secondary | ICD-10-CM | POA: Diagnosis not present

## 2023-11-09 NOTE — Discharge Summary (Signed)
 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   Problems Addressed During Hospitalization: Patient Active Problem List  Diagnosis  . Gastroesophageal reflux disease without esophagitis  . PTSD (post-traumatic stress disorder)  . Squamous cell carcinoma of vulva (CMS/HHS-HCC)  . Vulvar cancer (CMS/HHS-HCC)  . Failed flap  . Radiation necrosis of skin and subcutaneous  . S/P flap graft  . Constipation  . Abnormal TSH  . Anemia  . Chronic pain  . Chronic anticoagulation    Brief Hospital Course by Problem:  #Acute AMS - improved   24 hours of acute altered mental status. Patient oriented x3 but with hallucinations and delusions per her partner. Head CT without acute findings. Differential is broad and given her profound hypothyroidism includes myxedema coma (low suspicion per endocrinology), adrenal insufficiency, infection particularly with reference to the PET-avid right groin collection, as well as medications (methadone + dilaudid  with recent stoppage and re-start). Per curbside with anesthesiology, very unlikely to be related to her recent ganglion impar alcohol neurolysis. Downgraded from MICU on  10/9. - TSH found to be 15.61 with Ftr 0.51 with random cortisol 0.3  - She was given hydrocortisone 100 mg IV in the ED - Seen by endocrinology and believe that this is due to adrenal insufficiency              - Initially started on hydrocortisone 50 mg q6h but started having severe side effects with thrashing and restlessness             - Hydrocortisone decreased to 20/10 mg AM/PM to be continued until outpatient visit scheduled on  - Patient's AMS improved 10/9 and she continues to be doing well now   #Recurrent Vulvar Squamous Cell Cancer Patient with complex treatment and reconstructive history as outline above, most recently undergoing chemotherapy with Carboplatin /Paclitaxel/Pembrolizumab for local recurrent disease.  - Scheduled for C3 on 10/7 but this was deferred iso AMS (see above) - Most recent PET on 10/6 without evidence of distant disease - Seen by palliative care and recommended following             - Would restart methadone at decreased dose (~50%) of 5mg  PO Q8H - Would discontinue PO Oxycodone  and restart home Dilaudid  4-6mg  PO Q4H PRN for moderate & severe pain - Continue IV Dilaudid  0.5 mg at increased frequency of Q4H PRN for severe & breakthrough pain - Continue lyrica 100mg  PO q8h (this was changed from gabapentin 9/18 d/t sedation) - Recommend Tylenol  1000mg  PO three times daily as needed  - Recommend advil  600mg  PO q8h as needed for mild & moderate pain - Patient reports pain is well controlled with PO pain regimen overnight - Current regimen: tylenol  975 mg q8h, ibuprofen  600 mg q8h, methadone 5mg  q8h, dilaudid  6 mg PO q3h PRN  - Plan is for patient to get  a second opinion at [facility] on Monday and would like to be discharged so that she can make this appointment  - UA with trace blood, trace leuks, 19 WBC but negative nitrites; will not treat given likely contaminated specimen   #Anemia - Admission Hgb 7.7 but asymptomatic  - Hgb dropped to 6.9 > transfused 1u pRBCs  10/9 > post-transfusion Hgb 8.9 - CBC this AM (10/11) with Hgb 10.3  - Discharge with iron supplement    #Hypothyroidism - TSH found to be 15.61 with Ftr 0.5 - Started on levothyroxine 88 mcg per endo recs   #Right groin fluid collection #Right lower extremity edema with c/f venous insufficiency vs DVT On PET and CT, there is a right groin fluid collection causing extrinsic compression of the right external iliac, likely explaining patients asymmetric RLE edema - LE dopplers negative for DVT   Additional diagnosis addressed:  Body mass index is 22.54 kg/m.  Condition at Discharge: Condition: stable Pain:  tylenol  975 mg q8h, ibuprofen  600 mg q8h, methadone 5mg  q8h, dilaudid  6 mg PO q3h PRN  VTE prophylaxis: Eliquis 2.5 mg BID, SCDs, encourage ambulation  Consults: Endocrinology, palliative care    Disposition: discharged to home, will follow up in clinic   Physical Exam at Discharge: Temp (24hrs), Avg:36.6 C (97.9 F), Min:36.4 C (97.5 F), Max:37.1 C (98.7 F)  Temp:  [36.4 C (97.5 F)-37.1 C (98.7 F)] 36.5 C (97.7 F) Heart Rate:  [73-81] 77 Resp:  [17-20] 18 BP: (106-118)/(60-75) 118/75 BP 118/75   Pulse 77   Temp 36.5 C (97.7 F)   Resp 18   Ht 154.9 cm (5' 1)   Wt 54.1 kg (119 lb 4.3 oz)   SpO2 97%   BMI 22.54 kg/m    Allergies and Medications at Discharge: Allergies  Allergen Reactions  . Amoxicillin Swelling and Rash  . Vancomycin Itching and Rash    Red Man Syndrome  . Dog Dander Itching and Rash      Medication List     START taking these medications    * hydrocortisone 10 MG tablet Commonly known as: CORTEF Take 1 tablet (10 mg total) by mouth once daily (evening)   * hydrocortisone 20 MG tablet Commonly known as: CORTEF Take 1 tablet (20 mg total) by mouth once daily (morning) Start taking on: November 10, 2023   levothyroxine 88 MCG tablet Commonly known as: SYNTHROID Take 1 tablet (88 mcg total) by mouth every morning before  breakfast (0630) Take on an empty stomach with a glass of water at least 30-60 minutes before breakfast. Start taking on: November 10, 2023      * * This list has 2 medication(s) that are the same as other medications prescribed for you. Read the directions carefully, and ask your doctor or other care provider to review them with you.          CONTINUE taking these medications    acetaminophen  500 MG tablet Commonly known as: TYLENOL    albuterol MDI (PROVENTIL, VENTOLIN, PROAIR) HFA 90 mcg/actuation inhaler   apixaban 2.5 mg tablet Commonly known as: ELIQUIS Take 1 tablet (2.5 mg total) by mouth every 12 (twelve) hours   busPIRone 10 MG tablet Commonly known as: BUSPAR Take 2 tablets (20 mg total) by mouth daily with breakfast   cholecalciferol (vitamin D3) 250 mcg (10,000 unit) capsule   dexAMETHasone  4 MG tablet Commonly known as: DECADRON  Take 2 tablets (8 mg) by mouth daily with food in the morning for  3 days following chemotherapy.   FENBENDAZOLE (BULK) MISC   ferrous sulfate 324 mg (65 mg iron) EC tablet Take 1 tablet (324 mg total) by mouth daily with breakfast   Herbal Supplement   HYDROmorphone  4 MG tablet Commonly known as: DILAUDID  Take 1-1.5 tablets (4-6 mg total) by mouth every 4 (four) hours as needed for Pain   IVERMECTIN ORAL   methadone 10 MG tablet Commonly known as: DOLOPHINE Take 10mg  in the morning, afternoon and 15mg  every evening.   naloxone  0.4 mg/mL injection Commonly known as: NARCAN  Inject 1 mL (0.4 mg total) into the vein as directed (For excessive PCA sedation:  Administer if RASS <= -3 and stat page house officer.) for up to 1 day   OLANZapine 5 MG tablet Commonly known as: ZyPREXA Take 1 tablet (5 mg) by mouth nightly for 4 nights, starting on the day of chemotherapy each cycle.   ondansetron  8 MG disintegrating tablet Commonly known as: ZOFRAN -ODT Take 1 tablet (8 mg total) by mouth every 8 (eight) hours as needed for Nausea or  Vomiting   ondansetron  8 MG tablet Commonly known as: ZOFRAN  Take 8 mg (1 tablet) by mouth every 8 hours as needed for nausea and/or vomiting.   pantoprazole 40 MG DR tablet Commonly known as: PROTONIX Take 1 tablet (40 mg total) by mouth once daily   polyethylene glycol powder Commonly known as: MIRALAX Take 17 g by mouth once daily for 168 days Mix in 4-8ounces of fluid prior to taking.   pregabalin 100 MG capsule Commonly known as: LYRICA Take 1 capsule (100 mg total) by mouth 3 (three) times daily   prochlorperazine  10 MG tablet Commonly known as: COMPAZINE  Take 10mg  (1 tablet) by mouth every 6 hours as needed for nausea if nausea not controlled.   sennosides-docusate 8.6-50 mg tablet Commonly known as: SENOKOT-S Take 1 tablet by mouth once daily   venlafaxine 150 MG XR capsule Commonly known as: EFFEXOR-XR Take 1 capsule (150 mg total) by mouth once daily   vitamin k 100 mcg tablet         Where to Get Your Medications     These medications were sent to [facility]  [address], [place] [address]    Hours: 8:30-5 MON-FRI Phone: [phone]  hydrocortisone 10 MG tablet hydrocortisone 20 MG tablet levothyroxine 88 MCG tablet      Follow-up:    [facility] Appointments: Appointments which have been scheduled for you    Nov 18, 2023 2:30 PM (Arrive by 2:00 PM) RETURN with Elinore Legions, NP [facility]) [address] [place] [address] [phone]     Nov 20, 2023 11:00 AM (Arrive by 10:45 AM) VIDEO VISIT FROM HOME with Rosina Nat Malachy Dasie, MD [facility] [address] [facility]) [address] [facility] [address] [facility] [address] [place], [place] [address] [phone]  Thank you for scheduling a telehealth video visit with your provider. Please follow these instructions prior to your appointment, which include downloading required applications if using a mobile device:     Instructions for Video Visits (Click link in Mychart)  Please complete eCheck-In before day of appointment (up to 5 days in advance). Contact Telehealth Support [phone] for any assistance needed.  Due to telehealth provider licensing regulations, you (the patient) must be physically present in [place]  for the video or phone visit, unless you meet the exceptions for [place]  and [place]  residents.  Rare exceptions  may apply.  For more information: [email or web address]     Dec 23, 2023 2:00 PM (Arrive by 1:45 PM) New Visit with Duwaine Dural, PT [facility] at [facility] ) [address] [place] [address] [phone]          Outside Appointments: 10/13 and 10/20 at [facility]   Outside Providers, with questions related to your patient's hospitalization, please contact the Gyn Oncology PA/Fellow on call  through the [facility] paging operator [phone].   Signed,  BENJAMIN MATTHEW JACOBS, MD  11/09/2023  Attestation Statement:   I personally saw and evaluated the patient, and participated in the management and treatment plan as documented in the resident/fellow note.  ANGELES ISIDOR CONSTABLE, MD  *Some images could not be shown.

## 2023-11-10 DIAGNOSIS — C519 Malignant neoplasm of vulva, unspecified: Secondary | ICD-10-CM | POA: Diagnosis not present

## 2023-11-10 DIAGNOSIS — K669 Disorder of peritoneum, unspecified: Secondary | ICD-10-CM | POA: Diagnosis not present

## 2023-11-11 ENCOUNTER — Telehealth: Payer: Self-pay

## 2023-11-11 NOTE — Transitions of Care (Post Inpatient/ED Visit) (Signed)
   11/11/2023  Name: Misty Lowe MRN: [id number] DOB: [birth year]  Today's TOC FU Call Status: Today's TOC FU Call Status:: Unsuccessful Call (1st Attempt) Unsuccessful Call (1st Attempt) Date: 11/11/23  Attempted to reach the patient regarding the most recent Inpatient/ED visit.  Follow Up Plan: Additional outreach attempts will be made to reach the patient to complete the Transitions of Care (Post Inpatient/ED visit) call.   Arvin Seip RN, BSN, CCM [employer], Population Health Case Manager Phone: [phone]

## 2023-11-12 ENCOUNTER — Telehealth: Payer: Self-pay

## 2023-11-12 DIAGNOSIS — C519 Malignant neoplasm of vulva, unspecified: Secondary | ICD-10-CM | POA: Diagnosis not present

## 2023-11-12 DIAGNOSIS — C541 Malignant neoplasm of endometrium: Secondary | ICD-10-CM | POA: Diagnosis not present

## 2023-11-12 DIAGNOSIS — C518 Malignant neoplasm of overlapping sites of vulva: Secondary | ICD-10-CM | POA: Diagnosis not present

## 2023-11-13 ENCOUNTER — Telehealth: Payer: Self-pay

## 2023-11-13 DIAGNOSIS — C519 Malignant neoplasm of vulva, unspecified: Secondary | ICD-10-CM | POA: Diagnosis not present

## 2023-11-13 NOTE — Transitions of Care (Post Inpatient/ED Visit) (Signed)
   11/13/2023  Name: Misty Lowe MRN: [id number] DOB: [birth year]  Today's TOC FU Call Status: Today's TOC FU Call Status:: Successful TOC FU Call Completed TOC FU Call Complete Date: 11/13/23 (Briefly spoke with patient who states she is in [place]  and is about to board a plane and states she feels she is well supported by [facility] and denied need for [facility] call/program) Patient's Name and Date of Birth confirmed.   Shona Prow RN, CCM [place]  VBCI-Population Health RN Care Manager [phone]

## 2023-11-18 DIAGNOSIS — G894 Chronic pain syndrome: Secondary | ICD-10-CM | POA: Diagnosis not present

## 2023-11-18 DIAGNOSIS — C519 Malignant neoplasm of vulva, unspecified: Secondary | ICD-10-CM | POA: Diagnosis not present

## 2023-11-18 DIAGNOSIS — G893 Neoplasm related pain (acute) (chronic): Secondary | ICD-10-CM | POA: Diagnosis not present

## 2023-11-20 DIAGNOSIS — R110 Nausea: Secondary | ICD-10-CM | POA: Diagnosis not present

## 2023-11-20 DIAGNOSIS — Z515 Encounter for palliative care: Secondary | ICD-10-CM | POA: Diagnosis not present

## 2023-11-20 DIAGNOSIS — G893 Neoplasm related pain (acute) (chronic): Secondary | ICD-10-CM | POA: Diagnosis not present

## 2023-11-20 DIAGNOSIS — K5903 Drug induced constipation: Secondary | ICD-10-CM | POA: Diagnosis not present

## 2023-11-21 DIAGNOSIS — Z5112 Encounter for antineoplastic immunotherapy: Secondary | ICD-10-CM | POA: Diagnosis not present

## 2023-11-21 DIAGNOSIS — C519 Malignant neoplasm of vulva, unspecified: Secondary | ICD-10-CM | POA: Diagnosis not present

## 2023-11-21 DIAGNOSIS — Z5111 Encounter for antineoplastic chemotherapy: Secondary | ICD-10-CM | POA: Diagnosis not present

## 2023-11-21 DIAGNOSIS — F4323 Adjustment disorder with mixed anxiety and depressed mood: Secondary | ICD-10-CM | POA: Diagnosis not present

## 2023-11-21 DIAGNOSIS — Z7189 Other specified counseling: Secondary | ICD-10-CM | POA: Diagnosis not present

## 2023-11-22 ENCOUNTER — Telehealth: Payer: Self-pay

## 2023-11-22 NOTE — Telephone Encounter (Signed)
 Copied from CRM [id number]. Topic: General - Other >> Nov 22, 2023 11:33 AM Thersia BROCKS wrote: Reason for CRM: [facility] in wanted to let provider know that she has been discharged out of hospital from 10/7 -10/11 at [facility]

## 2023-11-25 DIAGNOSIS — C519 Malignant neoplasm of vulva, unspecified: Secondary | ICD-10-CM | POA: Diagnosis not present

## 2023-11-25 DIAGNOSIS — C774 Secondary and unspecified malignant neoplasm of inguinal and lower limb lymph nodes: Secondary | ICD-10-CM | POA: Diagnosis not present

## 2023-11-26 DIAGNOSIS — C519 Malignant neoplasm of vulva, unspecified: Secondary | ICD-10-CM | POA: Diagnosis not present

## 2023-11-26 DIAGNOSIS — G939 Disorder of brain, unspecified: Secondary | ICD-10-CM | POA: Diagnosis not present

## 2023-11-27 DIAGNOSIS — C519 Malignant neoplasm of vulva, unspecified: Secondary | ICD-10-CM | POA: Diagnosis not present

## 2023-11-28 DIAGNOSIS — Z5112 Encounter for antineoplastic immunotherapy: Secondary | ICD-10-CM | POA: Diagnosis not present

## 2023-11-28 DIAGNOSIS — Z79899 Other long term (current) drug therapy: Secondary | ICD-10-CM | POA: Diagnosis not present

## 2023-11-28 DIAGNOSIS — C518 Malignant neoplasm of overlapping sites of vulva: Secondary | ICD-10-CM | POA: Diagnosis not present

## 2023-11-28 DIAGNOSIS — Z79634 Long term (current) use of topoisomerase inhibitor: Secondary | ICD-10-CM | POA: Diagnosis not present

## 2023-11-28 DIAGNOSIS — Z5111 Encounter for antineoplastic chemotherapy: Secondary | ICD-10-CM | POA: Diagnosis not present

## 2023-11-29 DIAGNOSIS — C519 Malignant neoplasm of vulva, unspecified: Secondary | ICD-10-CM | POA: Diagnosis not present

## 2023-11-29 DIAGNOSIS — E2740 Unspecified adrenocortical insufficiency: Secondary | ICD-10-CM | POA: Diagnosis not present

## 2023-11-29 DIAGNOSIS — E039 Hypothyroidism, unspecified: Secondary | ICD-10-CM | POA: Diagnosis not present

## 2023-11-29 DIAGNOSIS — D509 Iron deficiency anemia, unspecified: Secondary | ICD-10-CM | POA: Diagnosis not present

## 2023-12-04 DIAGNOSIS — K5900 Constipation, unspecified: Secondary | ICD-10-CM | POA: Diagnosis not present

## 2023-12-04 DIAGNOSIS — G893 Neoplasm related pain (acute) (chronic): Secondary | ICD-10-CM | POA: Diagnosis not present

## 2023-12-04 DIAGNOSIS — K5903 Drug induced constipation: Secondary | ICD-10-CM | POA: Diagnosis not present

## 2023-12-04 DIAGNOSIS — Z515 Encounter for palliative care: Secondary | ICD-10-CM | POA: Diagnosis not present

## 2023-12-05 DIAGNOSIS — F4323 Adjustment disorder with mixed anxiety and depressed mood: Secondary | ICD-10-CM | POA: Diagnosis not present

## 2023-12-05 DIAGNOSIS — C518 Malignant neoplasm of overlapping sites of vulva: Secondary | ICD-10-CM | POA: Diagnosis not present

## 2023-12-05 DIAGNOSIS — Z79634 Long term (current) use of topoisomerase inhibitor: Secondary | ICD-10-CM | POA: Diagnosis not present

## 2023-12-05 DIAGNOSIS — Z5111 Encounter for antineoplastic chemotherapy: Secondary | ICD-10-CM | POA: Diagnosis not present

## 2023-12-12 DIAGNOSIS — Z7189 Other specified counseling: Secondary | ICD-10-CM | POA: Diagnosis not present

## 2023-12-12 DIAGNOSIS — Z515 Encounter for palliative care: Secondary | ICD-10-CM | POA: Diagnosis not present

## 2023-12-12 DIAGNOSIS — C519 Malignant neoplasm of vulva, unspecified: Secondary | ICD-10-CM | POA: Diagnosis not present

## 2023-12-12 DIAGNOSIS — G893 Neoplasm related pain (acute) (chronic): Secondary | ICD-10-CM | POA: Diagnosis not present

## 2023-12-13 DIAGNOSIS — T402X5A Adverse effect of other opioids, initial encounter: Secondary | ICD-10-CM | POA: Diagnosis not present

## 2023-12-13 DIAGNOSIS — C519 Malignant neoplasm of vulva, unspecified: Secondary | ICD-10-CM | POA: Diagnosis not present

## 2023-12-13 DIAGNOSIS — G893 Neoplasm related pain (acute) (chronic): Secondary | ICD-10-CM | POA: Diagnosis not present

## 2023-12-13 DIAGNOSIS — K5903 Drug induced constipation: Secondary | ICD-10-CM | POA: Diagnosis not present

## 2023-12-14 DIAGNOSIS — F1190 Opioid use, unspecified, uncomplicated: Secondary | ICD-10-CM | POA: Diagnosis not present

## 2023-12-14 DIAGNOSIS — Z515 Encounter for palliative care: Secondary | ICD-10-CM | POA: Diagnosis not present

## 2023-12-14 DIAGNOSIS — K5903 Drug induced constipation: Secondary | ICD-10-CM | POA: Diagnosis not present

## 2023-12-15 DIAGNOSIS — K5903 Drug induced constipation: Secondary | ICD-10-CM | POA: Diagnosis not present

## 2023-12-15 DIAGNOSIS — C519 Malignant neoplasm of vulva, unspecified: Secondary | ICD-10-CM | POA: Diagnosis not present

## 2023-12-15 DIAGNOSIS — F1190 Opioid use, unspecified, uncomplicated: Secondary | ICD-10-CM | POA: Diagnosis not present

## 2023-12-15 DIAGNOSIS — Z515 Encounter for palliative care: Secondary | ICD-10-CM | POA: Diagnosis not present

## 2023-12-16 DIAGNOSIS — Z7189 Other specified counseling: Secondary | ICD-10-CM | POA: Diagnosis not present

## 2023-12-16 DIAGNOSIS — F1190 Opioid use, unspecified, uncomplicated: Secondary | ICD-10-CM | POA: Diagnosis not present

## 2023-12-16 DIAGNOSIS — Z515 Encounter for palliative care: Secondary | ICD-10-CM | POA: Diagnosis not present

## 2023-12-16 DIAGNOSIS — G893 Neoplasm related pain (acute) (chronic): Secondary | ICD-10-CM | POA: Diagnosis not present

## 2023-12-16 DIAGNOSIS — C519 Malignant neoplasm of vulva, unspecified: Secondary | ICD-10-CM | POA: Diagnosis not present

## 2023-12-17 DIAGNOSIS — G893 Neoplasm related pain (acute) (chronic): Secondary | ICD-10-CM | POA: Diagnosis not present

## 2023-12-17 DIAGNOSIS — C519 Malignant neoplasm of vulva, unspecified: Secondary | ICD-10-CM | POA: Diagnosis not present

## 2023-12-17 DIAGNOSIS — Z515 Encounter for palliative care: Secondary | ICD-10-CM | POA: Diagnosis not present

## 2023-12-18 DIAGNOSIS — C519 Malignant neoplasm of vulva, unspecified: Secondary | ICD-10-CM | POA: Diagnosis not present

## 2023-12-18 DIAGNOSIS — K5903 Drug induced constipation: Secondary | ICD-10-CM | POA: Diagnosis not present

## 2023-12-18 DIAGNOSIS — M8530 Osteitis condensans, unspecified site: Secondary | ICD-10-CM | POA: Diagnosis not present

## 2023-12-18 DIAGNOSIS — G893 Neoplasm related pain (acute) (chronic): Secondary | ICD-10-CM | POA: Diagnosis not present

## 2023-12-18 DIAGNOSIS — Z515 Encounter for palliative care: Secondary | ICD-10-CM | POA: Diagnosis not present

## 2023-12-19 DIAGNOSIS — M8530 Osteitis condensans, unspecified site: Secondary | ICD-10-CM | POA: Diagnosis not present

## 2023-12-19 DIAGNOSIS — G893 Neoplasm related pain (acute) (chronic): Secondary | ICD-10-CM | POA: Diagnosis not present

## 2023-12-19 DIAGNOSIS — C519 Malignant neoplasm of vulva, unspecified: Secondary | ICD-10-CM | POA: Diagnosis not present

## 2023-12-19 DIAGNOSIS — K5903 Drug induced constipation: Secondary | ICD-10-CM | POA: Diagnosis not present

## 2023-12-19 DIAGNOSIS — Z515 Encounter for palliative care: Secondary | ICD-10-CM | POA: Diagnosis not present

## 2023-12-20 DIAGNOSIS — K5903 Drug induced constipation: Secondary | ICD-10-CM | POA: Diagnosis not present

## 2023-12-20 DIAGNOSIS — G893 Neoplasm related pain (acute) (chronic): Secondary | ICD-10-CM | POA: Diagnosis not present

## 2023-12-20 DIAGNOSIS — Z515 Encounter for palliative care: Secondary | ICD-10-CM | POA: Diagnosis not present

## 2023-12-20 DIAGNOSIS — Z7189 Other specified counseling: Secondary | ICD-10-CM | POA: Diagnosis not present

## 2023-12-20 DIAGNOSIS — C519 Malignant neoplasm of vulva, unspecified: Secondary | ICD-10-CM | POA: Diagnosis not present

## 2023-12-20 DIAGNOSIS — M8530 Osteitis condensans, unspecified site: Secondary | ICD-10-CM | POA: Diagnosis not present

## 2023-12-20 NOTE — Telephone Encounter (Unsigned)
 Copied from CRM [id number]. Topic: General - Other >> Dec 20, 2023  2:28 PM Sasha M wrote: Reason for CRM: Misty Lowe from [facility] [phone] Misty Lowe called in today to inform Misty Lowe that pt is currently admitted to [facility] and has been there since 11/13. Pt initially went in for severe pelvic pain and has been kept for treatment. She us  due to be discharged potentially next week and may be going home with home hospice care.

## 2023-12-21 DIAGNOSIS — Z7189 Other specified counseling: Secondary | ICD-10-CM | POA: Diagnosis not present

## 2023-12-21 DIAGNOSIS — T402X5A Adverse effect of other opioids, initial encounter: Secondary | ICD-10-CM | POA: Diagnosis not present

## 2023-12-21 DIAGNOSIS — K5903 Drug induced constipation: Secondary | ICD-10-CM | POA: Diagnosis not present

## 2023-12-21 DIAGNOSIS — F411 Generalized anxiety disorder: Secondary | ICD-10-CM | POA: Diagnosis not present

## 2023-12-21 DIAGNOSIS — M8530 Osteitis condensans, unspecified site: Secondary | ICD-10-CM | POA: Diagnosis not present

## 2023-12-22 DIAGNOSIS — G893 Neoplasm related pain (acute) (chronic): Secondary | ICD-10-CM | POA: Diagnosis not present

## 2023-12-22 DIAGNOSIS — F411 Generalized anxiety disorder: Secondary | ICD-10-CM | POA: Diagnosis not present

## 2023-12-22 DIAGNOSIS — Z7189 Other specified counseling: Secondary | ICD-10-CM | POA: Diagnosis not present

## 2023-12-22 DIAGNOSIS — C519 Malignant neoplasm of vulva, unspecified: Secondary | ICD-10-CM | POA: Diagnosis not present

## 2023-12-23 DIAGNOSIS — Z515 Encounter for palliative care: Secondary | ICD-10-CM | POA: Diagnosis not present

## 2023-12-23 DIAGNOSIS — G893 Neoplasm related pain (acute) (chronic): Secondary | ICD-10-CM | POA: Diagnosis not present

## 2023-12-23 DIAGNOSIS — C519 Malignant neoplasm of vulva, unspecified: Secondary | ICD-10-CM | POA: Diagnosis not present

## 2023-12-23 DIAGNOSIS — Z7189 Other specified counseling: Secondary | ICD-10-CM | POA: Diagnosis not present

## 2023-12-24 DIAGNOSIS — Z515 Encounter for palliative care: Secondary | ICD-10-CM | POA: Diagnosis not present

## 2023-12-24 DIAGNOSIS — C519 Malignant neoplasm of vulva, unspecified: Secondary | ICD-10-CM | POA: Diagnosis not present

## 2023-12-24 DIAGNOSIS — Z7189 Other specified counseling: Secondary | ICD-10-CM | POA: Diagnosis not present

## 2023-12-24 DIAGNOSIS — G893 Neoplasm related pain (acute) (chronic): Secondary | ICD-10-CM | POA: Diagnosis not present

## 2023-12-25 DIAGNOSIS — C519 Malignant neoplasm of vulva, unspecified: Secondary | ICD-10-CM | POA: Diagnosis not present

## 2023-12-25 DIAGNOSIS — Z7189 Other specified counseling: Secondary | ICD-10-CM | POA: Diagnosis not present

## 2023-12-25 DIAGNOSIS — G893 Neoplasm related pain (acute) (chronic): Secondary | ICD-10-CM | POA: Diagnosis not present

## 2023-12-25 DIAGNOSIS — Z515 Encounter for palliative care: Secondary | ICD-10-CM | POA: Diagnosis not present

## 2023-12-26 DIAGNOSIS — C519 Malignant neoplasm of vulva, unspecified: Secondary | ICD-10-CM | POA: Diagnosis not present

## 2023-12-27 DIAGNOSIS — G893 Neoplasm related pain (acute) (chronic): Secondary | ICD-10-CM | POA: Diagnosis not present

## 2023-12-27 DIAGNOSIS — Z515 Encounter for palliative care: Secondary | ICD-10-CM | POA: Diagnosis not present

## 2023-12-27 DIAGNOSIS — C519 Malignant neoplasm of vulva, unspecified: Secondary | ICD-10-CM | POA: Diagnosis not present

## 2023-12-27 DIAGNOSIS — K5903 Drug induced constipation: Secondary | ICD-10-CM | POA: Diagnosis not present

## 2023-12-28 DIAGNOSIS — Z515 Encounter for palliative care: Secondary | ICD-10-CM | POA: Diagnosis not present

## 2023-12-28 DIAGNOSIS — K5903 Drug induced constipation: Secondary | ICD-10-CM | POA: Diagnosis not present

## 2023-12-29 DIAGNOSIS — C519 Malignant neoplasm of vulva, unspecified: Secondary | ICD-10-CM | POA: Diagnosis not present

## 2023-12-29 DIAGNOSIS — G893 Neoplasm related pain (acute) (chronic): Secondary | ICD-10-CM | POA: Diagnosis not present

## 2023-12-29 DIAGNOSIS — K5903 Drug induced constipation: Secondary | ICD-10-CM | POA: Diagnosis not present

## 2023-12-29 DIAGNOSIS — Z515 Encounter for palliative care: Secondary | ICD-10-CM | POA: Diagnosis not present

## 2023-12-30 DIAGNOSIS — E2740 Unspecified adrenocortical insufficiency: Secondary | ICD-10-CM | POA: Diagnosis not present

## 2023-12-30 DIAGNOSIS — F4310 Post-traumatic stress disorder, unspecified: Secondary | ICD-10-CM | POA: Diagnosis not present

## 2023-12-30 DIAGNOSIS — Z9221 Personal history of antineoplastic chemotherapy: Secondary | ICD-10-CM | POA: Diagnosis not present

## 2023-12-30 DIAGNOSIS — R52 Pain, unspecified: Secondary | ICD-10-CM | POA: Diagnosis not present

## 2023-12-30 DIAGNOSIS — Z923 Personal history of irradiation: Secondary | ICD-10-CM | POA: Diagnosis not present

## 2023-12-30 DIAGNOSIS — F419 Anxiety disorder, unspecified: Secondary | ICD-10-CM | POA: Diagnosis not present

## 2023-12-30 DIAGNOSIS — M86159 Other acute osteomyelitis, unspecified femur: Secondary | ICD-10-CM | POA: Diagnosis not present

## 2023-12-30 DIAGNOSIS — F339 Major depressive disorder, recurrent, unspecified: Secondary | ICD-10-CM | POA: Diagnosis not present

## 2023-12-30 DIAGNOSIS — G893 Neoplasm related pain (acute) (chronic): Secondary | ICD-10-CM | POA: Diagnosis not present

## 2023-12-30 DIAGNOSIS — F1120 Opioid dependence, uncomplicated: Secondary | ICD-10-CM | POA: Diagnosis not present

## 2023-12-30 DIAGNOSIS — K219 Gastro-esophageal reflux disease without esophagitis: Secondary | ICD-10-CM | POA: Diagnosis not present

## 2023-12-30 DIAGNOSIS — Z9079 Acquired absence of other genital organ(s): Secondary | ICD-10-CM | POA: Diagnosis not present

## 2023-12-30 DIAGNOSIS — C519 Malignant neoplasm of vulva, unspecified: Secondary | ICD-10-CM | POA: Diagnosis not present

## 2023-12-30 DIAGNOSIS — D649 Anemia, unspecified: Secondary | ICD-10-CM | POA: Diagnosis not present

## 2023-12-31 ENCOUNTER — Telehealth: Payer: Self-pay

## 2023-12-31 DIAGNOSIS — M86159 Other acute osteomyelitis, unspecified femur: Secondary | ICD-10-CM | POA: Diagnosis not present

## 2023-12-31 DIAGNOSIS — C519 Malignant neoplasm of vulva, unspecified: Secondary | ICD-10-CM | POA: Diagnosis not present

## 2023-12-31 DIAGNOSIS — G893 Neoplasm related pain (acute) (chronic): Secondary | ICD-10-CM | POA: Diagnosis not present

## 2023-12-31 DIAGNOSIS — F339 Major depressive disorder, recurrent, unspecified: Secondary | ICD-10-CM | POA: Diagnosis not present

## 2023-12-31 DIAGNOSIS — D649 Anemia, unspecified: Secondary | ICD-10-CM | POA: Diagnosis not present

## 2023-12-31 DIAGNOSIS — E2740 Unspecified adrenocortical insufficiency: Secondary | ICD-10-CM | POA: Diagnosis not present

## 2023-12-31 DIAGNOSIS — Z9079 Acquired absence of other genital organ(s): Secondary | ICD-10-CM | POA: Diagnosis not present

## 2023-12-31 DIAGNOSIS — Z9221 Personal history of antineoplastic chemotherapy: Secondary | ICD-10-CM | POA: Diagnosis not present

## 2023-12-31 DIAGNOSIS — K219 Gastro-esophageal reflux disease without esophagitis: Secondary | ICD-10-CM | POA: Diagnosis not present

## 2023-12-31 DIAGNOSIS — Z923 Personal history of irradiation: Secondary | ICD-10-CM | POA: Diagnosis not present

## 2023-12-31 DIAGNOSIS — F4310 Post-traumatic stress disorder, unspecified: Secondary | ICD-10-CM | POA: Diagnosis not present

## 2023-12-31 DIAGNOSIS — F419 Anxiety disorder, unspecified: Secondary | ICD-10-CM | POA: Diagnosis not present

## 2023-12-31 NOTE — Transitions of Care (Post Inpatient/ED Visit) (Signed)
   12/31/2023  Name: Misty Lowe MRN: [id number] DOB: [birth year]  Today's TOC FU Call Status: Today's TOC FU Call Status:: Successful TOC FU Call Completed TOC FU Call Complete Date: 12/30/23  Patient's Name and Date of Birth confirmed. With [facility] in [facility] hospice.    Transition Care Management Follow-up Telephone Call Date of Discharge: 12/30/23 Discharge Facility: Other ([facility]) Name of Other ([facility]) Discharge Facility: [facility] Type of Discharge: Inpatient Admission Primary Inpatient Discharge Diagnosis:: Vulvar squamous cell carcinoma  Telephone call to [facility] in [facility] Spoke with Meghan in medical records and confirmed patient admitted to hospice on 12/30/23.       Arvin Seip RN, BSN, CCM [employer], Population Health Case Manager Phone: [phone]

## 2024-01-01 DIAGNOSIS — F419 Anxiety disorder, unspecified: Secondary | ICD-10-CM | POA: Diagnosis not present

## 2024-01-01 DIAGNOSIS — F339 Major depressive disorder, recurrent, unspecified: Secondary | ICD-10-CM | POA: Diagnosis not present

## 2024-01-01 DIAGNOSIS — E2740 Unspecified adrenocortical insufficiency: Secondary | ICD-10-CM | POA: Diagnosis not present

## 2024-01-01 DIAGNOSIS — Z9221 Personal history of antineoplastic chemotherapy: Secondary | ICD-10-CM | POA: Diagnosis not present

## 2024-01-01 DIAGNOSIS — C519 Malignant neoplasm of vulva, unspecified: Secondary | ICD-10-CM | POA: Diagnosis not present

## 2024-01-01 DIAGNOSIS — D649 Anemia, unspecified: Secondary | ICD-10-CM | POA: Diagnosis not present

## 2024-01-01 DIAGNOSIS — M86159 Other acute osteomyelitis, unspecified femur: Secondary | ICD-10-CM | POA: Diagnosis not present

## 2024-01-01 DIAGNOSIS — G893 Neoplasm related pain (acute) (chronic): Secondary | ICD-10-CM | POA: Diagnosis not present

## 2024-01-01 DIAGNOSIS — Z923 Personal history of irradiation: Secondary | ICD-10-CM | POA: Diagnosis not present

## 2024-01-01 DIAGNOSIS — K219 Gastro-esophageal reflux disease without esophagitis: Secondary | ICD-10-CM | POA: Diagnosis not present

## 2024-01-01 DIAGNOSIS — Z9079 Acquired absence of other genital organ(s): Secondary | ICD-10-CM | POA: Diagnosis not present

## 2024-01-01 DIAGNOSIS — F4310 Post-traumatic stress disorder, unspecified: Secondary | ICD-10-CM | POA: Diagnosis not present

## 2024-01-02 DIAGNOSIS — F339 Major depressive disorder, recurrent, unspecified: Secondary | ICD-10-CM | POA: Diagnosis not present

## 2024-01-02 DIAGNOSIS — F4323 Adjustment disorder with mixed anxiety and depressed mood: Secondary | ICD-10-CM | POA: Diagnosis not present

## 2024-01-02 DIAGNOSIS — F419 Anxiety disorder, unspecified: Secondary | ICD-10-CM | POA: Diagnosis not present

## 2024-01-02 DIAGNOSIS — M86159 Other acute osteomyelitis, unspecified femur: Secondary | ICD-10-CM | POA: Diagnosis not present

## 2024-01-02 DIAGNOSIS — K219 Gastro-esophageal reflux disease without esophagitis: Secondary | ICD-10-CM | POA: Diagnosis not present

## 2024-01-02 DIAGNOSIS — Z9221 Personal history of antineoplastic chemotherapy: Secondary | ICD-10-CM | POA: Diagnosis not present

## 2024-01-02 DIAGNOSIS — E2740 Unspecified adrenocortical insufficiency: Secondary | ICD-10-CM | POA: Diagnosis not present

## 2024-01-02 DIAGNOSIS — Z923 Personal history of irradiation: Secondary | ICD-10-CM | POA: Diagnosis not present

## 2024-01-02 DIAGNOSIS — D649 Anemia, unspecified: Secondary | ICD-10-CM | POA: Diagnosis not present

## 2024-01-02 DIAGNOSIS — C519 Malignant neoplasm of vulva, unspecified: Secondary | ICD-10-CM | POA: Diagnosis not present

## 2024-01-02 DIAGNOSIS — G893 Neoplasm related pain (acute) (chronic): Secondary | ICD-10-CM | POA: Diagnosis not present

## 2024-01-02 DIAGNOSIS — F4310 Post-traumatic stress disorder, unspecified: Secondary | ICD-10-CM | POA: Diagnosis not present

## 2024-01-02 DIAGNOSIS — Z9079 Acquired absence of other genital organ(s): Secondary | ICD-10-CM | POA: Diagnosis not present

## 2024-01-03 DIAGNOSIS — Z9079 Acquired absence of other genital organ(s): Secondary | ICD-10-CM | POA: Diagnosis not present

## 2024-01-03 DIAGNOSIS — K219 Gastro-esophageal reflux disease without esophagitis: Secondary | ICD-10-CM | POA: Diagnosis not present

## 2024-01-03 DIAGNOSIS — F339 Major depressive disorder, recurrent, unspecified: Secondary | ICD-10-CM | POA: Diagnosis not present

## 2024-01-03 DIAGNOSIS — C519 Malignant neoplasm of vulva, unspecified: Secondary | ICD-10-CM | POA: Diagnosis not present

## 2024-01-03 DIAGNOSIS — F4310 Post-traumatic stress disorder, unspecified: Secondary | ICD-10-CM | POA: Diagnosis not present

## 2024-01-03 DIAGNOSIS — F419 Anxiety disorder, unspecified: Secondary | ICD-10-CM | POA: Diagnosis not present

## 2024-01-03 DIAGNOSIS — Z923 Personal history of irradiation: Secondary | ICD-10-CM | POA: Diagnosis not present

## 2024-01-03 DIAGNOSIS — D649 Anemia, unspecified: Secondary | ICD-10-CM | POA: Diagnosis not present

## 2024-01-03 DIAGNOSIS — M86159 Other acute osteomyelitis, unspecified femur: Secondary | ICD-10-CM | POA: Diagnosis not present

## 2024-01-03 DIAGNOSIS — Z9221 Personal history of antineoplastic chemotherapy: Secondary | ICD-10-CM | POA: Diagnosis not present

## 2024-01-03 DIAGNOSIS — G893 Neoplasm related pain (acute) (chronic): Secondary | ICD-10-CM | POA: Diagnosis not present

## 2024-01-03 DIAGNOSIS — E2740 Unspecified adrenocortical insufficiency: Secondary | ICD-10-CM | POA: Diagnosis not present

## 2024-01-04 DIAGNOSIS — Z9079 Acquired absence of other genital organ(s): Secondary | ICD-10-CM | POA: Diagnosis not present

## 2024-01-04 DIAGNOSIS — E2740 Unspecified adrenocortical insufficiency: Secondary | ICD-10-CM | POA: Diagnosis not present

## 2024-01-04 DIAGNOSIS — F419 Anxiety disorder, unspecified: Secondary | ICD-10-CM | POA: Diagnosis not present

## 2024-01-04 DIAGNOSIS — Z923 Personal history of irradiation: Secondary | ICD-10-CM | POA: Diagnosis not present

## 2024-01-04 DIAGNOSIS — F4310 Post-traumatic stress disorder, unspecified: Secondary | ICD-10-CM | POA: Diagnosis not present

## 2024-01-04 DIAGNOSIS — D649 Anemia, unspecified: Secondary | ICD-10-CM | POA: Diagnosis not present

## 2024-01-04 DIAGNOSIS — M86159 Other acute osteomyelitis, unspecified femur: Secondary | ICD-10-CM | POA: Diagnosis not present

## 2024-01-04 DIAGNOSIS — G893 Neoplasm related pain (acute) (chronic): Secondary | ICD-10-CM | POA: Diagnosis not present

## 2024-01-04 DIAGNOSIS — F339 Major depressive disorder, recurrent, unspecified: Secondary | ICD-10-CM | POA: Diagnosis not present

## 2024-01-04 DIAGNOSIS — K219 Gastro-esophageal reflux disease without esophagitis: Secondary | ICD-10-CM | POA: Diagnosis not present

## 2024-01-04 DIAGNOSIS — C519 Malignant neoplasm of vulva, unspecified: Secondary | ICD-10-CM | POA: Diagnosis not present

## 2024-01-04 DIAGNOSIS — Z9221 Personal history of antineoplastic chemotherapy: Secondary | ICD-10-CM | POA: Diagnosis not present

## 2024-01-05 DIAGNOSIS — Z9221 Personal history of antineoplastic chemotherapy: Secondary | ICD-10-CM | POA: Diagnosis not present

## 2024-01-05 DIAGNOSIS — K219 Gastro-esophageal reflux disease without esophagitis: Secondary | ICD-10-CM | POA: Diagnosis not present

## 2024-01-05 DIAGNOSIS — F339 Major depressive disorder, recurrent, unspecified: Secondary | ICD-10-CM | POA: Diagnosis not present

## 2024-01-05 DIAGNOSIS — F419 Anxiety disorder, unspecified: Secondary | ICD-10-CM | POA: Diagnosis not present

## 2024-01-05 DIAGNOSIS — D649 Anemia, unspecified: Secondary | ICD-10-CM | POA: Diagnosis not present

## 2024-01-05 DIAGNOSIS — Z9079 Acquired absence of other genital organ(s): Secondary | ICD-10-CM | POA: Diagnosis not present

## 2024-01-05 DIAGNOSIS — F4310 Post-traumatic stress disorder, unspecified: Secondary | ICD-10-CM | POA: Diagnosis not present

## 2024-01-05 DIAGNOSIS — C519 Malignant neoplasm of vulva, unspecified: Secondary | ICD-10-CM | POA: Diagnosis not present

## 2024-01-05 DIAGNOSIS — G893 Neoplasm related pain (acute) (chronic): Secondary | ICD-10-CM | POA: Diagnosis not present

## 2024-01-05 DIAGNOSIS — E2740 Unspecified adrenocortical insufficiency: Secondary | ICD-10-CM | POA: Diagnosis not present

## 2024-01-05 DIAGNOSIS — M86159 Other acute osteomyelitis, unspecified femur: Secondary | ICD-10-CM | POA: Diagnosis not present

## 2024-01-05 DIAGNOSIS — Z923 Personal history of irradiation: Secondary | ICD-10-CM | POA: Diagnosis not present

## 2024-01-06 DIAGNOSIS — F419 Anxiety disorder, unspecified: Secondary | ICD-10-CM | POA: Diagnosis not present

## 2024-01-06 DIAGNOSIS — F4310 Post-traumatic stress disorder, unspecified: Secondary | ICD-10-CM | POA: Diagnosis not present

## 2024-01-06 DIAGNOSIS — D649 Anemia, unspecified: Secondary | ICD-10-CM | POA: Diagnosis not present

## 2024-01-06 DIAGNOSIS — Z9079 Acquired absence of other genital organ(s): Secondary | ICD-10-CM | POA: Diagnosis not present

## 2024-01-06 DIAGNOSIS — K219 Gastro-esophageal reflux disease without esophagitis: Secondary | ICD-10-CM | POA: Diagnosis not present

## 2024-01-06 DIAGNOSIS — C519 Malignant neoplasm of vulva, unspecified: Secondary | ICD-10-CM | POA: Diagnosis not present

## 2024-01-06 DIAGNOSIS — Z923 Personal history of irradiation: Secondary | ICD-10-CM | POA: Diagnosis not present

## 2024-01-06 DIAGNOSIS — Z9221 Personal history of antineoplastic chemotherapy: Secondary | ICD-10-CM | POA: Diagnosis not present

## 2024-01-06 DIAGNOSIS — F339 Major depressive disorder, recurrent, unspecified: Secondary | ICD-10-CM | POA: Diagnosis not present

## 2024-01-06 DIAGNOSIS — M86159 Other acute osteomyelitis, unspecified femur: Secondary | ICD-10-CM | POA: Diagnosis not present

## 2024-01-06 DIAGNOSIS — G893 Neoplasm related pain (acute) (chronic): Secondary | ICD-10-CM | POA: Diagnosis not present

## 2024-01-06 DIAGNOSIS — E2740 Unspecified adrenocortical insufficiency: Secondary | ICD-10-CM | POA: Diagnosis not present

## 2024-01-07 DIAGNOSIS — D649 Anemia, unspecified: Secondary | ICD-10-CM | POA: Diagnosis not present

## 2024-01-07 DIAGNOSIS — Z9221 Personal history of antineoplastic chemotherapy: Secondary | ICD-10-CM | POA: Diagnosis not present

## 2024-01-07 DIAGNOSIS — F339 Major depressive disorder, recurrent, unspecified: Secondary | ICD-10-CM | POA: Diagnosis not present

## 2024-01-07 DIAGNOSIS — Z9079 Acquired absence of other genital organ(s): Secondary | ICD-10-CM | POA: Diagnosis not present

## 2024-01-07 DIAGNOSIS — F419 Anxiety disorder, unspecified: Secondary | ICD-10-CM | POA: Diagnosis not present

## 2024-01-07 DIAGNOSIS — K219 Gastro-esophageal reflux disease without esophagitis: Secondary | ICD-10-CM | POA: Diagnosis not present

## 2024-01-07 DIAGNOSIS — E2740 Unspecified adrenocortical insufficiency: Secondary | ICD-10-CM | POA: Diagnosis not present

## 2024-01-07 DIAGNOSIS — F4310 Post-traumatic stress disorder, unspecified: Secondary | ICD-10-CM | POA: Diagnosis not present

## 2024-01-07 DIAGNOSIS — G893 Neoplasm related pain (acute) (chronic): Secondary | ICD-10-CM | POA: Diagnosis not present

## 2024-01-07 DIAGNOSIS — M86159 Other acute osteomyelitis, unspecified femur: Secondary | ICD-10-CM | POA: Diagnosis not present

## 2024-01-07 DIAGNOSIS — C519 Malignant neoplasm of vulva, unspecified: Secondary | ICD-10-CM | POA: Diagnosis not present

## 2024-01-07 DIAGNOSIS — Z923 Personal history of irradiation: Secondary | ICD-10-CM | POA: Diagnosis not present

## 2024-01-08 DIAGNOSIS — F419 Anxiety disorder, unspecified: Secondary | ICD-10-CM | POA: Diagnosis not present

## 2024-01-08 DIAGNOSIS — C519 Malignant neoplasm of vulva, unspecified: Secondary | ICD-10-CM | POA: Diagnosis not present

## 2024-01-08 DIAGNOSIS — Z9079 Acquired absence of other genital organ(s): Secondary | ICD-10-CM | POA: Diagnosis not present

## 2024-01-08 DIAGNOSIS — G893 Neoplasm related pain (acute) (chronic): Secondary | ICD-10-CM | POA: Diagnosis not present

## 2024-01-08 DIAGNOSIS — M86159 Other acute osteomyelitis, unspecified femur: Secondary | ICD-10-CM | POA: Diagnosis not present

## 2024-01-08 DIAGNOSIS — Z923 Personal history of irradiation: Secondary | ICD-10-CM | POA: Diagnosis not present

## 2024-01-08 DIAGNOSIS — F4310 Post-traumatic stress disorder, unspecified: Secondary | ICD-10-CM | POA: Diagnosis not present

## 2024-01-08 DIAGNOSIS — D649 Anemia, unspecified: Secondary | ICD-10-CM | POA: Diagnosis not present

## 2024-01-08 DIAGNOSIS — E2740 Unspecified adrenocortical insufficiency: Secondary | ICD-10-CM | POA: Diagnosis not present

## 2024-01-08 DIAGNOSIS — K219 Gastro-esophageal reflux disease without esophagitis: Secondary | ICD-10-CM | POA: Diagnosis not present

## 2024-01-08 DIAGNOSIS — F339 Major depressive disorder, recurrent, unspecified: Secondary | ICD-10-CM | POA: Diagnosis not present

## 2024-01-08 DIAGNOSIS — Z9221 Personal history of antineoplastic chemotherapy: Secondary | ICD-10-CM | POA: Diagnosis not present

## 2024-01-09 DIAGNOSIS — Z9079 Acquired absence of other genital organ(s): Secondary | ICD-10-CM | POA: Diagnosis not present

## 2024-01-09 DIAGNOSIS — C519 Malignant neoplasm of vulva, unspecified: Secondary | ICD-10-CM | POA: Diagnosis not present

## 2024-01-09 DIAGNOSIS — D649 Anemia, unspecified: Secondary | ICD-10-CM | POA: Diagnosis not present

## 2024-01-09 DIAGNOSIS — F339 Major depressive disorder, recurrent, unspecified: Secondary | ICD-10-CM | POA: Diagnosis not present

## 2024-01-09 DIAGNOSIS — K219 Gastro-esophageal reflux disease without esophagitis: Secondary | ICD-10-CM | POA: Diagnosis not present

## 2024-01-09 DIAGNOSIS — G893 Neoplasm related pain (acute) (chronic): Secondary | ICD-10-CM | POA: Diagnosis not present

## 2024-01-09 DIAGNOSIS — E2740 Unspecified adrenocortical insufficiency: Secondary | ICD-10-CM | POA: Diagnosis not present

## 2024-01-09 DIAGNOSIS — Z923 Personal history of irradiation: Secondary | ICD-10-CM | POA: Diagnosis not present

## 2024-01-09 DIAGNOSIS — F4310 Post-traumatic stress disorder, unspecified: Secondary | ICD-10-CM | POA: Diagnosis not present

## 2024-01-09 DIAGNOSIS — M86159 Other acute osteomyelitis, unspecified femur: Secondary | ICD-10-CM | POA: Diagnosis not present

## 2024-01-09 DIAGNOSIS — Z9221 Personal history of antineoplastic chemotherapy: Secondary | ICD-10-CM | POA: Diagnosis not present

## 2024-01-09 DIAGNOSIS — F4323 Adjustment disorder with mixed anxiety and depressed mood: Secondary | ICD-10-CM | POA: Diagnosis not present

## 2024-01-09 DIAGNOSIS — F419 Anxiety disorder, unspecified: Secondary | ICD-10-CM | POA: Diagnosis not present

## 2024-01-10 DIAGNOSIS — C519 Malignant neoplasm of vulva, unspecified: Secondary | ICD-10-CM | POA: Diagnosis not present

## 2024-01-10 DIAGNOSIS — K219 Gastro-esophageal reflux disease without esophagitis: Secondary | ICD-10-CM | POA: Diagnosis not present

## 2024-01-10 DIAGNOSIS — F4310 Post-traumatic stress disorder, unspecified: Secondary | ICD-10-CM | POA: Diagnosis not present

## 2024-01-10 DIAGNOSIS — F339 Major depressive disorder, recurrent, unspecified: Secondary | ICD-10-CM | POA: Diagnosis not present

## 2024-01-10 DIAGNOSIS — Z9079 Acquired absence of other genital organ(s): Secondary | ICD-10-CM | POA: Diagnosis not present

## 2024-01-10 DIAGNOSIS — G893 Neoplasm related pain (acute) (chronic): Secondary | ICD-10-CM | POA: Diagnosis not present

## 2024-01-10 DIAGNOSIS — M86159 Other acute osteomyelitis, unspecified femur: Secondary | ICD-10-CM | POA: Diagnosis not present

## 2024-01-10 DIAGNOSIS — E2740 Unspecified adrenocortical insufficiency: Secondary | ICD-10-CM | POA: Diagnosis not present

## 2024-01-10 DIAGNOSIS — D649 Anemia, unspecified: Secondary | ICD-10-CM | POA: Diagnosis not present

## 2024-01-10 DIAGNOSIS — Z9221 Personal history of antineoplastic chemotherapy: Secondary | ICD-10-CM | POA: Diagnosis not present

## 2024-01-10 DIAGNOSIS — Z923 Personal history of irradiation: Secondary | ICD-10-CM | POA: Diagnosis not present

## 2024-01-10 DIAGNOSIS — F419 Anxiety disorder, unspecified: Secondary | ICD-10-CM | POA: Diagnosis not present

## 2024-01-11 DIAGNOSIS — K219 Gastro-esophageal reflux disease without esophagitis: Secondary | ICD-10-CM | POA: Diagnosis not present

## 2024-01-11 DIAGNOSIS — E2740 Unspecified adrenocortical insufficiency: Secondary | ICD-10-CM | POA: Diagnosis not present

## 2024-01-11 DIAGNOSIS — Z923 Personal history of irradiation: Secondary | ICD-10-CM | POA: Diagnosis not present

## 2024-01-11 DIAGNOSIS — D649 Anemia, unspecified: Secondary | ICD-10-CM | POA: Diagnosis not present

## 2024-01-11 DIAGNOSIS — G893 Neoplasm related pain (acute) (chronic): Secondary | ICD-10-CM | POA: Diagnosis not present

## 2024-01-11 DIAGNOSIS — M86159 Other acute osteomyelitis, unspecified femur: Secondary | ICD-10-CM | POA: Diagnosis not present

## 2024-01-11 DIAGNOSIS — Z9221 Personal history of antineoplastic chemotherapy: Secondary | ICD-10-CM | POA: Diagnosis not present

## 2024-01-11 DIAGNOSIS — F4310 Post-traumatic stress disorder, unspecified: Secondary | ICD-10-CM | POA: Diagnosis not present

## 2024-01-11 DIAGNOSIS — C519 Malignant neoplasm of vulva, unspecified: Secondary | ICD-10-CM | POA: Diagnosis not present

## 2024-01-11 DIAGNOSIS — F339 Major depressive disorder, recurrent, unspecified: Secondary | ICD-10-CM | POA: Diagnosis not present

## 2024-01-11 DIAGNOSIS — Z9079 Acquired absence of other genital organ(s): Secondary | ICD-10-CM | POA: Diagnosis not present

## 2024-01-11 DIAGNOSIS — F419 Anxiety disorder, unspecified: Secondary | ICD-10-CM | POA: Diagnosis not present

## 2024-01-12 DIAGNOSIS — C519 Malignant neoplasm of vulva, unspecified: Secondary | ICD-10-CM | POA: Diagnosis not present

## 2024-01-12 DIAGNOSIS — D649 Anemia, unspecified: Secondary | ICD-10-CM | POA: Diagnosis not present

## 2024-01-12 DIAGNOSIS — G893 Neoplasm related pain (acute) (chronic): Secondary | ICD-10-CM | POA: Diagnosis not present

## 2024-01-12 DIAGNOSIS — Z9079 Acquired absence of other genital organ(s): Secondary | ICD-10-CM | POA: Diagnosis not present

## 2024-01-12 DIAGNOSIS — F339 Major depressive disorder, recurrent, unspecified: Secondary | ICD-10-CM | POA: Diagnosis not present

## 2024-01-12 DIAGNOSIS — M86159 Other acute osteomyelitis, unspecified femur: Secondary | ICD-10-CM | POA: Diagnosis not present

## 2024-01-12 DIAGNOSIS — Z9221 Personal history of antineoplastic chemotherapy: Secondary | ICD-10-CM | POA: Diagnosis not present

## 2024-01-12 DIAGNOSIS — F419 Anxiety disorder, unspecified: Secondary | ICD-10-CM | POA: Diagnosis not present

## 2024-01-12 DIAGNOSIS — K219 Gastro-esophageal reflux disease without esophagitis: Secondary | ICD-10-CM | POA: Diagnosis not present

## 2024-01-12 DIAGNOSIS — E2740 Unspecified adrenocortical insufficiency: Secondary | ICD-10-CM | POA: Diagnosis not present

## 2024-01-12 DIAGNOSIS — Z923 Personal history of irradiation: Secondary | ICD-10-CM | POA: Diagnosis not present

## 2024-01-12 DIAGNOSIS — F4310 Post-traumatic stress disorder, unspecified: Secondary | ICD-10-CM | POA: Diagnosis not present

## 2024-01-13 DIAGNOSIS — Z923 Personal history of irradiation: Secondary | ICD-10-CM | POA: Diagnosis not present

## 2024-01-13 DIAGNOSIS — M86159 Other acute osteomyelitis, unspecified femur: Secondary | ICD-10-CM | POA: Diagnosis not present

## 2024-01-13 DIAGNOSIS — Z9079 Acquired absence of other genital organ(s): Secondary | ICD-10-CM | POA: Diagnosis not present

## 2024-01-13 DIAGNOSIS — C519 Malignant neoplasm of vulva, unspecified: Secondary | ICD-10-CM | POA: Diagnosis not present

## 2024-01-13 DIAGNOSIS — F4310 Post-traumatic stress disorder, unspecified: Secondary | ICD-10-CM | POA: Diagnosis not present

## 2024-01-13 DIAGNOSIS — G893 Neoplasm related pain (acute) (chronic): Secondary | ICD-10-CM | POA: Diagnosis not present

## 2024-01-13 DIAGNOSIS — D649 Anemia, unspecified: Secondary | ICD-10-CM | POA: Diagnosis not present

## 2024-01-13 DIAGNOSIS — E2740 Unspecified adrenocortical insufficiency: Secondary | ICD-10-CM | POA: Diagnosis not present

## 2024-01-13 DIAGNOSIS — F419 Anxiety disorder, unspecified: Secondary | ICD-10-CM | POA: Diagnosis not present

## 2024-01-13 DIAGNOSIS — Z9221 Personal history of antineoplastic chemotherapy: Secondary | ICD-10-CM | POA: Diagnosis not present

## 2024-01-13 DIAGNOSIS — K219 Gastro-esophageal reflux disease without esophagitis: Secondary | ICD-10-CM | POA: Diagnosis not present

## 2024-01-13 DIAGNOSIS — F339 Major depressive disorder, recurrent, unspecified: Secondary | ICD-10-CM | POA: Diagnosis not present

## 2024-01-14 DIAGNOSIS — F419 Anxiety disorder, unspecified: Secondary | ICD-10-CM | POA: Diagnosis not present

## 2024-01-14 DIAGNOSIS — Z9221 Personal history of antineoplastic chemotherapy: Secondary | ICD-10-CM | POA: Diagnosis not present

## 2024-01-14 DIAGNOSIS — Z923 Personal history of irradiation: Secondary | ICD-10-CM | POA: Diagnosis not present

## 2024-01-14 DIAGNOSIS — F339 Major depressive disorder, recurrent, unspecified: Secondary | ICD-10-CM | POA: Diagnosis not present

## 2024-01-14 DIAGNOSIS — F4310 Post-traumatic stress disorder, unspecified: Secondary | ICD-10-CM | POA: Diagnosis not present

## 2024-01-14 DIAGNOSIS — G893 Neoplasm related pain (acute) (chronic): Secondary | ICD-10-CM | POA: Diagnosis not present

## 2024-01-14 DIAGNOSIS — Z9079 Acquired absence of other genital organ(s): Secondary | ICD-10-CM | POA: Diagnosis not present

## 2024-01-14 DIAGNOSIS — M86159 Other acute osteomyelitis, unspecified femur: Secondary | ICD-10-CM | POA: Diagnosis not present

## 2024-01-14 DIAGNOSIS — K219 Gastro-esophageal reflux disease without esophagitis: Secondary | ICD-10-CM | POA: Diagnosis not present

## 2024-01-14 DIAGNOSIS — D649 Anemia, unspecified: Secondary | ICD-10-CM | POA: Diagnosis not present

## 2024-01-14 DIAGNOSIS — C519 Malignant neoplasm of vulva, unspecified: Secondary | ICD-10-CM | POA: Diagnosis not present

## 2024-01-14 DIAGNOSIS — E2740 Unspecified adrenocortical insufficiency: Secondary | ICD-10-CM | POA: Diagnosis not present

## 2024-01-15 DIAGNOSIS — F4310 Post-traumatic stress disorder, unspecified: Secondary | ICD-10-CM | POA: Diagnosis not present

## 2024-01-15 DIAGNOSIS — C519 Malignant neoplasm of vulva, unspecified: Secondary | ICD-10-CM | POA: Diagnosis not present

## 2024-01-15 DIAGNOSIS — G893 Neoplasm related pain (acute) (chronic): Secondary | ICD-10-CM | POA: Diagnosis not present

## 2024-01-15 DIAGNOSIS — F339 Major depressive disorder, recurrent, unspecified: Secondary | ICD-10-CM | POA: Diagnosis not present

## 2024-01-15 DIAGNOSIS — F419 Anxiety disorder, unspecified: Secondary | ICD-10-CM | POA: Diagnosis not present

## 2024-01-15 DIAGNOSIS — K219 Gastro-esophageal reflux disease without esophagitis: Secondary | ICD-10-CM | POA: Diagnosis not present

## 2024-01-15 DIAGNOSIS — Z9221 Personal history of antineoplastic chemotherapy: Secondary | ICD-10-CM | POA: Diagnosis not present

## 2024-01-15 DIAGNOSIS — M86159 Other acute osteomyelitis, unspecified femur: Secondary | ICD-10-CM | POA: Diagnosis not present

## 2024-01-15 DIAGNOSIS — E2740 Unspecified adrenocortical insufficiency: Secondary | ICD-10-CM | POA: Diagnosis not present

## 2024-01-15 DIAGNOSIS — Z923 Personal history of irradiation: Secondary | ICD-10-CM | POA: Diagnosis not present

## 2024-01-15 DIAGNOSIS — D649 Anemia, unspecified: Secondary | ICD-10-CM | POA: Diagnosis not present

## 2024-01-15 DIAGNOSIS — Z9079 Acquired absence of other genital organ(s): Secondary | ICD-10-CM | POA: Diagnosis not present

## 2024-01-16 DIAGNOSIS — E2740 Unspecified adrenocortical insufficiency: Secondary | ICD-10-CM | POA: Diagnosis not present

## 2024-01-16 DIAGNOSIS — F419 Anxiety disorder, unspecified: Secondary | ICD-10-CM | POA: Diagnosis not present

## 2024-01-16 DIAGNOSIS — Z9221 Personal history of antineoplastic chemotherapy: Secondary | ICD-10-CM | POA: Diagnosis not present

## 2024-01-16 DIAGNOSIS — Z9079 Acquired absence of other genital organ(s): Secondary | ICD-10-CM | POA: Diagnosis not present

## 2024-01-16 DIAGNOSIS — Z923 Personal history of irradiation: Secondary | ICD-10-CM | POA: Diagnosis not present

## 2024-01-16 DIAGNOSIS — F339 Major depressive disorder, recurrent, unspecified: Secondary | ICD-10-CM | POA: Diagnosis not present

## 2024-01-16 DIAGNOSIS — K219 Gastro-esophageal reflux disease without esophagitis: Secondary | ICD-10-CM | POA: Diagnosis not present

## 2024-01-16 DIAGNOSIS — D649 Anemia, unspecified: Secondary | ICD-10-CM | POA: Diagnosis not present

## 2024-01-16 DIAGNOSIS — C519 Malignant neoplasm of vulva, unspecified: Secondary | ICD-10-CM | POA: Diagnosis not present

## 2024-01-16 DIAGNOSIS — F4310 Post-traumatic stress disorder, unspecified: Secondary | ICD-10-CM | POA: Diagnosis not present

## 2024-01-16 DIAGNOSIS — G893 Neoplasm related pain (acute) (chronic): Secondary | ICD-10-CM | POA: Diagnosis not present

## 2024-01-16 DIAGNOSIS — M86159 Other acute osteomyelitis, unspecified femur: Secondary | ICD-10-CM | POA: Diagnosis not present

## 2024-01-17 DIAGNOSIS — F339 Major depressive disorder, recurrent, unspecified: Secondary | ICD-10-CM | POA: Diagnosis not present

## 2024-01-17 DIAGNOSIS — Z9079 Acquired absence of other genital organ(s): Secondary | ICD-10-CM | POA: Diagnosis not present

## 2024-01-17 DIAGNOSIS — Z9221 Personal history of antineoplastic chemotherapy: Secondary | ICD-10-CM | POA: Diagnosis not present

## 2024-01-17 DIAGNOSIS — F419 Anxiety disorder, unspecified: Secondary | ICD-10-CM | POA: Diagnosis not present

## 2024-01-17 DIAGNOSIS — Z923 Personal history of irradiation: Secondary | ICD-10-CM | POA: Diagnosis not present

## 2024-01-17 DIAGNOSIS — E2740 Unspecified adrenocortical insufficiency: Secondary | ICD-10-CM | POA: Diagnosis not present

## 2024-01-17 DIAGNOSIS — C519 Malignant neoplasm of vulva, unspecified: Secondary | ICD-10-CM | POA: Diagnosis not present

## 2024-01-17 DIAGNOSIS — G893 Neoplasm related pain (acute) (chronic): Secondary | ICD-10-CM | POA: Diagnosis not present

## 2024-01-17 DIAGNOSIS — D649 Anemia, unspecified: Secondary | ICD-10-CM | POA: Diagnosis not present

## 2024-01-17 DIAGNOSIS — F4310 Post-traumatic stress disorder, unspecified: Secondary | ICD-10-CM | POA: Diagnosis not present

## 2024-01-17 DIAGNOSIS — M86159 Other acute osteomyelitis, unspecified femur: Secondary | ICD-10-CM | POA: Diagnosis not present

## 2024-01-17 DIAGNOSIS — K219 Gastro-esophageal reflux disease without esophagitis: Secondary | ICD-10-CM | POA: Diagnosis not present

## 2024-01-18 DIAGNOSIS — E2740 Unspecified adrenocortical insufficiency: Secondary | ICD-10-CM | POA: Diagnosis not present

## 2024-01-18 DIAGNOSIS — K219 Gastro-esophageal reflux disease without esophagitis: Secondary | ICD-10-CM | POA: Diagnosis not present

## 2024-01-18 DIAGNOSIS — M86159 Other acute osteomyelitis, unspecified femur: Secondary | ICD-10-CM | POA: Diagnosis not present

## 2024-01-18 DIAGNOSIS — F339 Major depressive disorder, recurrent, unspecified: Secondary | ICD-10-CM | POA: Diagnosis not present

## 2024-01-18 DIAGNOSIS — Z923 Personal history of irradiation: Secondary | ICD-10-CM | POA: Diagnosis not present

## 2024-01-18 DIAGNOSIS — D649 Anemia, unspecified: Secondary | ICD-10-CM | POA: Diagnosis not present

## 2024-01-18 DIAGNOSIS — F419 Anxiety disorder, unspecified: Secondary | ICD-10-CM | POA: Diagnosis not present

## 2024-01-18 DIAGNOSIS — F4310 Post-traumatic stress disorder, unspecified: Secondary | ICD-10-CM | POA: Diagnosis not present

## 2024-01-18 DIAGNOSIS — C519 Malignant neoplasm of vulva, unspecified: Secondary | ICD-10-CM | POA: Diagnosis not present

## 2024-01-18 DIAGNOSIS — Z9221 Personal history of antineoplastic chemotherapy: Secondary | ICD-10-CM | POA: Diagnosis not present

## 2024-01-18 DIAGNOSIS — G893 Neoplasm related pain (acute) (chronic): Secondary | ICD-10-CM | POA: Diagnosis not present

## 2024-01-18 DIAGNOSIS — Z9079 Acquired absence of other genital organ(s): Secondary | ICD-10-CM | POA: Diagnosis not present

## 2024-01-19 DIAGNOSIS — G893 Neoplasm related pain (acute) (chronic): Secondary | ICD-10-CM | POA: Diagnosis not present

## 2024-01-19 DIAGNOSIS — F4310 Post-traumatic stress disorder, unspecified: Secondary | ICD-10-CM | POA: Diagnosis not present

## 2024-01-19 DIAGNOSIS — Z9079 Acquired absence of other genital organ(s): Secondary | ICD-10-CM | POA: Diagnosis not present

## 2024-01-19 DIAGNOSIS — F339 Major depressive disorder, recurrent, unspecified: Secondary | ICD-10-CM | POA: Diagnosis not present

## 2024-01-19 DIAGNOSIS — F419 Anxiety disorder, unspecified: Secondary | ICD-10-CM | POA: Diagnosis not present

## 2024-01-19 DIAGNOSIS — K219 Gastro-esophageal reflux disease without esophagitis: Secondary | ICD-10-CM | POA: Diagnosis not present

## 2024-01-19 DIAGNOSIS — C519 Malignant neoplasm of vulva, unspecified: Secondary | ICD-10-CM | POA: Diagnosis not present

## 2024-01-19 DIAGNOSIS — E2740 Unspecified adrenocortical insufficiency: Secondary | ICD-10-CM | POA: Diagnosis not present

## 2024-01-19 DIAGNOSIS — D649 Anemia, unspecified: Secondary | ICD-10-CM | POA: Diagnosis not present

## 2024-01-19 DIAGNOSIS — Z9221 Personal history of antineoplastic chemotherapy: Secondary | ICD-10-CM | POA: Diagnosis not present

## 2024-01-19 DIAGNOSIS — M86159 Other acute osteomyelitis, unspecified femur: Secondary | ICD-10-CM | POA: Diagnosis not present

## 2024-01-19 DIAGNOSIS — Z923 Personal history of irradiation: Secondary | ICD-10-CM | POA: Diagnosis not present

## 2024-01-20 DIAGNOSIS — G893 Neoplasm related pain (acute) (chronic): Secondary | ICD-10-CM | POA: Diagnosis not present

## 2024-01-20 DIAGNOSIS — Z9079 Acquired absence of other genital organ(s): Secondary | ICD-10-CM | POA: Diagnosis not present

## 2024-01-20 DIAGNOSIS — M86159 Other acute osteomyelitis, unspecified femur: Secondary | ICD-10-CM | POA: Diagnosis not present

## 2024-01-20 DIAGNOSIS — Z923 Personal history of irradiation: Secondary | ICD-10-CM | POA: Diagnosis not present

## 2024-01-20 DIAGNOSIS — C519 Malignant neoplasm of vulva, unspecified: Secondary | ICD-10-CM | POA: Diagnosis not present

## 2024-01-20 DIAGNOSIS — F339 Major depressive disorder, recurrent, unspecified: Secondary | ICD-10-CM | POA: Diagnosis not present

## 2024-01-20 DIAGNOSIS — Z9221 Personal history of antineoplastic chemotherapy: Secondary | ICD-10-CM | POA: Diagnosis not present

## 2024-01-20 DIAGNOSIS — F4310 Post-traumatic stress disorder, unspecified: Secondary | ICD-10-CM | POA: Diagnosis not present

## 2024-01-20 DIAGNOSIS — D649 Anemia, unspecified: Secondary | ICD-10-CM | POA: Diagnosis not present

## 2024-01-20 DIAGNOSIS — K219 Gastro-esophageal reflux disease without esophagitis: Secondary | ICD-10-CM | POA: Diagnosis not present

## 2024-01-20 DIAGNOSIS — E2740 Unspecified adrenocortical insufficiency: Secondary | ICD-10-CM | POA: Diagnosis not present

## 2024-01-20 DIAGNOSIS — F419 Anxiety disorder, unspecified: Secondary | ICD-10-CM | POA: Diagnosis not present

## 2024-01-21 DIAGNOSIS — Z9221 Personal history of antineoplastic chemotherapy: Secondary | ICD-10-CM | POA: Diagnosis not present

## 2024-01-21 DIAGNOSIS — F339 Major depressive disorder, recurrent, unspecified: Secondary | ICD-10-CM | POA: Diagnosis not present

## 2024-01-21 DIAGNOSIS — F419 Anxiety disorder, unspecified: Secondary | ICD-10-CM | POA: Diagnosis not present

## 2024-01-21 DIAGNOSIS — D649 Anemia, unspecified: Secondary | ICD-10-CM | POA: Diagnosis not present

## 2024-01-21 DIAGNOSIS — G893 Neoplasm related pain (acute) (chronic): Secondary | ICD-10-CM | POA: Diagnosis not present

## 2024-01-21 DIAGNOSIS — C519 Malignant neoplasm of vulva, unspecified: Secondary | ICD-10-CM | POA: Diagnosis not present

## 2024-01-21 DIAGNOSIS — E2740 Unspecified adrenocortical insufficiency: Secondary | ICD-10-CM | POA: Diagnosis not present

## 2024-01-21 DIAGNOSIS — F4310 Post-traumatic stress disorder, unspecified: Secondary | ICD-10-CM | POA: Diagnosis not present

## 2024-01-21 DIAGNOSIS — K219 Gastro-esophageal reflux disease without esophagitis: Secondary | ICD-10-CM | POA: Diagnosis not present

## 2024-01-21 DIAGNOSIS — M86159 Other acute osteomyelitis, unspecified femur: Secondary | ICD-10-CM | POA: Diagnosis not present

## 2024-01-21 DIAGNOSIS — Z923 Personal history of irradiation: Secondary | ICD-10-CM | POA: Diagnosis not present

## 2024-01-21 DIAGNOSIS — Z9079 Acquired absence of other genital organ(s): Secondary | ICD-10-CM | POA: Diagnosis not present

## 2024-01-22 DIAGNOSIS — F339 Major depressive disorder, recurrent, unspecified: Secondary | ICD-10-CM | POA: Diagnosis not present

## 2024-01-22 DIAGNOSIS — Z923 Personal history of irradiation: Secondary | ICD-10-CM | POA: Diagnosis not present

## 2024-01-22 DIAGNOSIS — D649 Anemia, unspecified: Secondary | ICD-10-CM | POA: Diagnosis not present

## 2024-01-22 DIAGNOSIS — F419 Anxiety disorder, unspecified: Secondary | ICD-10-CM | POA: Diagnosis not present

## 2024-01-22 DIAGNOSIS — G893 Neoplasm related pain (acute) (chronic): Secondary | ICD-10-CM | POA: Diagnosis not present

## 2024-01-22 DIAGNOSIS — E2740 Unspecified adrenocortical insufficiency: Secondary | ICD-10-CM | POA: Diagnosis not present

## 2024-01-22 DIAGNOSIS — F4310 Post-traumatic stress disorder, unspecified: Secondary | ICD-10-CM | POA: Diagnosis not present

## 2024-01-22 DIAGNOSIS — M86159 Other acute osteomyelitis, unspecified femur: Secondary | ICD-10-CM | POA: Diagnosis not present

## 2024-01-22 DIAGNOSIS — Z9079 Acquired absence of other genital organ(s): Secondary | ICD-10-CM | POA: Diagnosis not present

## 2024-01-22 DIAGNOSIS — K219 Gastro-esophageal reflux disease without esophagitis: Secondary | ICD-10-CM | POA: Diagnosis not present

## 2024-01-22 DIAGNOSIS — C519 Malignant neoplasm of vulva, unspecified: Secondary | ICD-10-CM | POA: Diagnosis not present

## 2024-01-22 DIAGNOSIS — Z9221 Personal history of antineoplastic chemotherapy: Secondary | ICD-10-CM | POA: Diagnosis not present

## 2024-01-30 DEATH — deceased
